# Patient Record
Sex: Female | Born: 1945 | Race: White | Hispanic: No | Marital: Married | Smoking: Never smoker
Health system: Southern US, Community
[De-identification: ages and names within clinical notes are randomized; demographics above are authoritative.]

## PROBLEM LIST (undated history)

## (undated) DIAGNOSIS — M5430 Sciatica, unspecified side: Secondary | ICD-10-CM

## (undated) DIAGNOSIS — Z780 Asymptomatic menopausal state: Secondary | ICD-10-CM

## (undated) DIAGNOSIS — E781 Pure hyperglyceridemia: Secondary | ICD-10-CM

## (undated) DIAGNOSIS — M8580 Other specified disorders of bone density and structure, unspecified site: Secondary | ICD-10-CM

## (undated) DIAGNOSIS — I10 Essential (primary) hypertension: Secondary | ICD-10-CM

## (undated) HISTORY — PX: OTHER SURGICAL HISTORY: SHX169

## (undated) HISTORY — DX: Other specified disorders of bone density and structure, unspecified site: M85.80

## (undated) HISTORY — DX: Pure hyperglyceridemia: E78.1

## (undated) HISTORY — DX: Sciatica, unspecified side: M54.30

## (undated) HISTORY — PX: BREAST BIOPSY: SHX20

## (undated) HISTORY — DX: Asymptomatic menopausal state: Z78.0

## (undated) HISTORY — PX: WISDOM TOOTH EXTRACTION: SHX21

---

## 1997-08-11 ENCOUNTER — Other Ambulatory Visit: Admission: RE | Admit: 1997-08-11 | Discharge: 1997-08-11 | Payer: Self-pay | Admitting: *Deleted

## 1999-03-14 ENCOUNTER — Other Ambulatory Visit: Admission: RE | Admit: 1999-03-14 | Discharge: 1999-03-14 | Payer: Self-pay | Admitting: Obstetrics and Gynecology

## 1999-08-30 ENCOUNTER — Encounter: Admission: RE | Admit: 1999-08-30 | Discharge: 1999-08-30 | Payer: Self-pay | Admitting: Obstetrics and Gynecology

## 1999-08-30 ENCOUNTER — Encounter: Payer: Self-pay | Admitting: Obstetrics and Gynecology

## 1999-10-14 ENCOUNTER — Encounter: Payer: Self-pay | Admitting: Obstetrics and Gynecology

## 1999-10-14 ENCOUNTER — Encounter: Admission: RE | Admit: 1999-10-14 | Discharge: 1999-10-14 | Payer: Self-pay | Admitting: Obstetrics and Gynecology

## 2000-05-03 ENCOUNTER — Other Ambulatory Visit: Admission: RE | Admit: 2000-05-03 | Discharge: 2000-05-03 | Payer: Self-pay | Admitting: Obstetrics and Gynecology

## 2001-05-28 ENCOUNTER — Other Ambulatory Visit: Admission: RE | Admit: 2001-05-28 | Discharge: 2001-05-28 | Payer: Self-pay | Admitting: Obstetrics and Gynecology

## 2001-06-14 ENCOUNTER — Encounter: Payer: Self-pay | Admitting: Obstetrics and Gynecology

## 2001-06-14 ENCOUNTER — Encounter: Admission: RE | Admit: 2001-06-14 | Discharge: 2001-06-14 | Payer: Self-pay | Admitting: Obstetrics and Gynecology

## 2001-11-01 ENCOUNTER — Encounter: Payer: Self-pay | Admitting: Obstetrics and Gynecology

## 2001-11-01 ENCOUNTER — Encounter: Admission: RE | Admit: 2001-11-01 | Discharge: 2001-11-01 | Payer: Self-pay | Admitting: Obstetrics and Gynecology

## 2004-12-26 ENCOUNTER — Encounter: Admission: RE | Admit: 2004-12-26 | Discharge: 2004-12-26 | Payer: Self-pay | Admitting: Obstetrics and Gynecology

## 2005-01-09 ENCOUNTER — Other Ambulatory Visit: Admission: RE | Admit: 2005-01-09 | Discharge: 2005-01-09 | Payer: Self-pay | Admitting: Obstetrics and Gynecology

## 2005-02-28 ENCOUNTER — Ambulatory Visit: Payer: Self-pay | Admitting: Internal Medicine

## 2005-03-15 ENCOUNTER — Ambulatory Visit: Payer: Self-pay | Admitting: Internal Medicine

## 2005-04-19 ENCOUNTER — Ambulatory Visit: Payer: Self-pay | Admitting: Internal Medicine

## 2007-03-07 ENCOUNTER — Encounter: Admission: RE | Admit: 2007-03-07 | Discharge: 2007-03-07 | Payer: Self-pay | Admitting: Internal Medicine

## 2007-03-07 ENCOUNTER — Ambulatory Visit: Payer: Self-pay | Admitting: Internal Medicine

## 2007-03-08 DIAGNOSIS — M899 Disorder of bone, unspecified: Secondary | ICD-10-CM | POA: Insufficient documentation

## 2007-03-08 DIAGNOSIS — E781 Pure hyperglyceridemia: Secondary | ICD-10-CM | POA: Insufficient documentation

## 2007-03-08 DIAGNOSIS — M949 Disorder of cartilage, unspecified: Secondary | ICD-10-CM

## 2007-03-11 ENCOUNTER — Ambulatory Visit: Payer: Self-pay | Admitting: Internal Medicine

## 2007-04-17 ENCOUNTER — Encounter: Payer: Self-pay | Admitting: Internal Medicine

## 2007-04-17 ENCOUNTER — Ambulatory Visit: Payer: Self-pay | Admitting: Internal Medicine

## 2007-06-25 ENCOUNTER — Encounter: Payer: Self-pay | Admitting: Internal Medicine

## 2007-07-16 ENCOUNTER — Telehealth (INDEPENDENT_AMBULATORY_CARE_PROVIDER_SITE_OTHER): Payer: Self-pay | Admitting: *Deleted

## 2008-10-05 ENCOUNTER — Ambulatory Visit: Payer: Self-pay | Admitting: Internal Medicine

## 2008-10-06 DIAGNOSIS — Z780 Asymptomatic menopausal state: Secondary | ICD-10-CM | POA: Insufficient documentation

## 2008-12-30 ENCOUNTER — Encounter (INDEPENDENT_AMBULATORY_CARE_PROVIDER_SITE_OTHER): Payer: Self-pay | Admitting: *Deleted

## 2009-07-09 ENCOUNTER — Ambulatory Visit: Payer: Self-pay | Admitting: Internal Medicine

## 2009-07-20 ENCOUNTER — Ambulatory Visit: Payer: Self-pay | Admitting: Internal Medicine

## 2009-07-20 LAB — CONVERTED CEMR LAB
Ketones, urine, test strip: NEGATIVE
Nitrite: POSITIVE
Urobilinogen, UA: 0.2

## 2009-07-29 ENCOUNTER — Encounter: Payer: Self-pay | Admitting: Internal Medicine

## 2009-10-20 ENCOUNTER — Ambulatory Visit: Payer: Self-pay | Admitting: Internal Medicine

## 2009-10-20 LAB — CONVERTED CEMR LAB
AST: 22 units/L (ref 0–37)
Albumin: 4.2 g/dL (ref 3.5–5.2)
Alkaline Phosphatase: 49 units/L (ref 39–117)
BUN: 15 mg/dL (ref 6–23)
Basophils Relative: 0.7 % (ref 0.0–3.0)
Chloride: 100 meq/L (ref 96–112)
GFR calc non Af Amer: 72.89 mL/min (ref >60)
Glucose, Bld: 85 mg/dL (ref 70–99)
Hemoglobin, Urine: NEGATIVE
Hemoglobin: 12.8 g/dL (ref 12.0–15.0)
Ketones, ur: NEGATIVE mg/dL
Lymphocytes Relative: 33.2 % (ref 12.0–46.0)
Monocytes Relative: 9.2 % (ref 3.0–12.0)
Neutro Abs: 3.6 10*3/uL (ref 1.4–7.7)
Potassium: 4.9 meq/L (ref 3.5–5.1)
RBC: 3.93 M/uL (ref 3.87–5.11)
Specific Gravity, Urine: 1.01 (ref 1.000–1.030)
Total CHOL/HDL Ratio: 5
Total Protein: 6.9 g/dL (ref 6.0–8.3)
Urine Glucose: NEGATIVE mg/dL
Urobilinogen, UA: 0.2 (ref 0.0–1.0)
VLDL: 41.2 mg/dL — ABNORMAL HIGH (ref 0.0–40.0)

## 2009-10-25 ENCOUNTER — Ambulatory Visit: Payer: Self-pay | Admitting: Internal Medicine

## 2009-10-25 ENCOUNTER — Encounter: Payer: Self-pay | Admitting: Internal Medicine

## 2009-10-28 ENCOUNTER — Encounter (INDEPENDENT_AMBULATORY_CARE_PROVIDER_SITE_OTHER): Payer: Self-pay | Admitting: *Deleted

## 2009-11-01 ENCOUNTER — Telehealth: Payer: Self-pay | Admitting: Internal Medicine

## 2009-11-29 ENCOUNTER — Encounter: Payer: Self-pay | Admitting: Internal Medicine

## 2009-12-02 ENCOUNTER — Encounter: Payer: Self-pay | Admitting: Internal Medicine

## 2009-12-09 ENCOUNTER — Encounter: Admission: RE | Admit: 2009-12-09 | Discharge: 2009-12-09 | Payer: Self-pay | Admitting: Internal Medicine

## 2010-02-18 ENCOUNTER — Encounter (INDEPENDENT_AMBULATORY_CARE_PROVIDER_SITE_OTHER): Payer: Self-pay | Admitting: *Deleted

## 2010-02-21 ENCOUNTER — Ambulatory Visit
Admission: RE | Admit: 2010-02-21 | Discharge: 2010-02-21 | Payer: Self-pay | Source: Home / Self Care | Attending: Internal Medicine | Admitting: Internal Medicine

## 2010-03-01 ENCOUNTER — Encounter: Payer: Self-pay | Admitting: Internal Medicine

## 2010-03-01 ENCOUNTER — Ambulatory Visit
Admission: RE | Admit: 2010-03-01 | Discharge: 2010-03-01 | Payer: Self-pay | Source: Home / Self Care | Attending: Internal Medicine | Admitting: Internal Medicine

## 2010-03-01 LAB — HM COLONOSCOPY

## 2010-03-13 LAB — CONVERTED CEMR LAB
ALT: 36 units/L — ABNORMAL HIGH (ref 0–35)
AST: 31 units/L (ref 0–37)
Albumin: 4.1 g/dL (ref 3.5–5.2)
Alkaline Phosphatase: 59 units/L (ref 39–117)
BUN: 10 mg/dL (ref 6–23)
Basophils Absolute: 0 10*3/uL (ref 0.0–0.1)
Basophils Relative: 0.4 % (ref 0.0–1.0)
Bilirubin Urine: NEGATIVE
Bilirubin, Direct: 0.1 mg/dL (ref 0.0–0.3)
CO2: 30 meq/L (ref 19–32)
Calcium: 9.7 mg/dL (ref 8.4–10.5)
Chloride: 100 meq/L (ref 96–112)
Cholesterol: 165 mg/dL (ref 0–200)
Creatinine, Ser: 0.9 mg/dL (ref 0.4–1.2)
Eosinophils Absolute: 0.2 10*3/uL (ref 0.0–0.6)
Eosinophils Relative: 2.4 % (ref 0.0–5.0)
GFR calc Af Amer: 82 mL/min
GFR calc non Af Amer: 68 mL/min
Glucose, Bld: 98 mg/dL (ref 70–99)
HCT: 36.9 % (ref 36.0–46.0)
HDL: 31.5 mg/dL — ABNORMAL LOW (ref >39.0)
Hemoglobin, Urine: NEGATIVE
Hemoglobin: 12.7 g/dL (ref 12.0–15.0)
Ketones, ur: NEGATIVE mg/dL
LDL Cholesterol: 109 mg/dL — ABNORMAL HIGH (ref 0–99)
Leukocytes, UA: NEGATIVE
Lymphocytes Relative: 36.5 % (ref 12.0–46.0)
MCHC: 34.5 g/dL (ref 30.0–36.0)
MCV: 93.3 fL (ref 78.0–100.0)
Monocytes Absolute: 0.7 10*3/uL (ref 0.2–0.7)
Monocytes Relative: 9.3 % (ref 3.0–11.0)
Neutro Abs: 3.5 10*3/uL (ref 1.4–7.7)
Neutrophils Relative %: 51.4 % (ref 43.0–77.0)
Nitrite: NEGATIVE
Platelets: 346 10*3/uL (ref 150–400)
Potassium: 4.4 meq/L (ref 3.5–5.1)
RBC: 3.95 M/uL (ref 3.87–5.11)
RDW: 11.8 % (ref 11.5–14.6)
Sodium: 137 meq/L (ref 135–145)
Specific Gravity, Urine: 1.01 (ref 1.000–1.03)
TSH: 2.32 microintl units/mL (ref 0.35–5.50)
Total Bilirubin: 0.6 mg/dL (ref 0.3–1.2)
Total CHOL/HDL Ratio: 5.2
Total Protein, Urine: NEGATIVE mg/dL
Total Protein: 6.9 g/dL (ref 6.0–8.3)
Triglycerides: 121 mg/dL (ref 0–149)
Urine Glucose: NEGATIVE mg/dL
Urobilinogen, UA: 0.2 (ref 0.0–1.0)
VLDL: 24 mg/dL (ref 0–40)
WBC: 7 10*3/uL (ref 4.5–10.5)
pH: 6 (ref 5.0–8.0)

## 2010-03-17 NOTE — Assessment & Plan Note (Signed)
Summary: ?uti/men/cd   Vital Signs:  Patient profile:   65 year old female Height:      65.5 inches Weight:      135.75 pounds O2 Sat:      97 % Temp:     97.0 degrees F oral Pulse rate:   82 / minute Pulse rhythm:   regular Resp:     16 per minute BP sitting:   142 / 84  (left arm) Cuff size:   large  Vitals Entered By: Rock Nephew CMA (July 20, 2009 8:46 AM) CC: urinary frequancy and burning, Dysuria   Primary Care Provider:  Illene Regulus  CC:  urinary frequancy and burning and Dysuria.  History of Present Illness:  Dysuria      This is a 65 year old woman who presents with Dysuria.  The symptoms began 2 days ago.  The intensity is described as moderate.  The patient reports burning with urination, urinary frequency, and urgency, but denies hematuria, vaginal discharge, vaginal itching, and vaginal sores.  The patient denies the following associated symptoms: nausea, vomiting, fever, shaking chills, flank pain, abdominal pain, back pain, pelvic pain, and arthralgias.  History is significant for no urinary tract problems.    Preventive Screening-Counseling & Management  Alcohol-Tobacco     Alcohol drinks/day: 0     Smoking Status: never  Hep-HIV-STD-Contraception     Hepatitis Risk: no risk noted     HIV Risk: no risk noted     STD Risk: no risk noted      Sexual History:  currently monogamous.        Drug Use:  never.        Blood Transfusions:  no.    Medications Prior to Update: 1)  Multivitamins   Tabs (Multiple Vitamin) .... Take 1 Tablet By Mouth Once A Day 2)  Alpha Lipoic Acid 300 Mg  Caps (Alpha-Lipoic Acid) .... Take 1 Tablet By Mouth Once A Day 3)  Black Cohosh 540 Mg  Caps (Black Cohosh) .... Take 1 Tablet By Mouth Two Times A Day 4)  Glucosamine Hcl 1000 Mg  Tabs (Glucosamine Hcl) .... Take 1 Tablet By Mouth Once A Day 5)  Omega-3 Fish Oil 1000 Mg  Caps (Omega-3 Fatty Acids) .... Take 1 Tablet By Mouth Once A Day 6)  Lecithin 1200 Mg  Caps  (Lecithin) .... Take 1 Tablet By Mouth Once A Day 7)  Vitamin E 400 Unit  Caps (Vitamin E) .... Take 1 Tablet By Mouth Once A Day 8)  B-100 Complex   Tabs (Vitamins-Lipotropics) .... Take 1 Tablet By Mouth Once A Day 9)  Calcium-Magnesium-Vitamin D 400-166.7-133.3 Mg-Mg-Uni  Tabs (Calcium-Magnesium-Vitamin D) .... Take 1 Tablet By Mouth Once A Day 10)  Vitamin D 1000 Unit  Tabs (Cholecalciferol) .... Take 1 Tablet By Mouth Once A Day  Current Medications (verified): 1)  Multivitamins   Tabs (Multiple Vitamin) .... Take 1 Tablet By Mouth Once A Day 2)  Alpha Lipoic Acid 300 Mg  Caps (Alpha-Lipoic Acid) .... Take 1 Tablet By Mouth Once A Day 3)  Black Cohosh 540 Mg  Caps (Black Cohosh) .... Take 1 Tablet By Mouth Two Times A Day 4)  Glucosamine Hcl 1000 Mg  Tabs (Glucosamine Hcl) .... Take 1 Tablet By Mouth Once A Day 5)  Omega-3 Fish Oil 1000 Mg  Caps (Omega-3 Fatty Acids) .... Take 1 Tablet By Mouth Once A Day 6)  Lecithin 1200 Mg  Caps (Lecithin) .... Take  1 Tablet By Mouth Once A Day 7)  Vitamin E 400 Unit  Caps (Vitamin E) .... Take 1 Tablet By Mouth Once A Day 8)  B-100 Complex   Tabs (Vitamins-Lipotropics) .... Take 1 Tablet By Mouth Once A Day 9)  Calcium-Magnesium-Vitamin D 400-166.7-133.3 Mg-Mg-Uni  Tabs (Calcium-Magnesium-Vitamin D) .... Take 1 Tablet By Mouth Once A Day 10)  Vitamin D 1000 Unit  Tabs (Cholecalciferol) .... Take 1 Tablet By Mouth Once A Day 11)  Cipro 250 Mg Tab (Ciprofloxacin Hcl) .... Take 1 Tablet By Mouth Morning and Night X 5 Days  Allergies (verified): No Known Drug Allergies  Past History:  Past Medical History: Last updated: 11/02/08 POSTMENOPAUSAL STATUS (ICD-V49.81) HYPERTRIGLYCERIDEMIA (ICD-272.1) OSTEOPENIA (ICD-733.90)  Past Surgical History: Last updated: 11/02/2008 Wisdom teeth extracted remotely   G0P0  Family History: Last updated: 11-02-2008 Mother deceased at age 57 - DM, Factor V Leiden, possible PE Father deceased at age 59 -  history of emphysema, Htn, CVA Sister - high cholesterol Youngest sister - breast cancer Neg- colon cancer, CAD/MI  Social History: Last updated: 11/02/08 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 Never Smoked Alcohol use-yes (social)  Risk Factors: Alcohol Use: 0 (07/20/2009)  Risk Factors: Smoking Status: never (07/20/2009)  Family History: Reviewed history from November 02, 2008 and no changes required. Mother deceased at age 18 - DM, Factor V Leiden, possible PE Father deceased at age 8 - history of emphysema, Htn, CVA Sister - high cholesterol Youngest sister - breast cancer Neg- colon cancer, CAD/MI  Social History: Reviewed history from 11/02/2008 and no changes required. 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 Never Smoked Alcohol use-yes (social) Hepatitis Risk:  no risk noted HIV Risk:  no risk noted STD Risk:  no risk noted Sexual History:  currently monogamous Drug Use:  never Blood Transfusions:  no  Review of Systems  The patient denies anorexia, fever, chest pain, abdominal pain, hematuria, incontinence, genital sores, suspicious skin lesions, and enlarged lymph nodes.    Physical Exam  General:  alert, well-developed, well-nourished, well-hydrated, appropriate dress, and healthy-appearing.   Head:  normocephalic, atraumatic, no abnormalities observed, and no abnormalities palpated.   Mouth:  Oral mucosa and oropharynx without lesions or exudates.  Teeth in good repair. Neck:  supple, full ROM, no masses, no thyromegaly, no JVD, normal carotid upstroke, and no carotid bruits.   Lungs:  normal respiratory effort, no intercostal retractions, no accessory muscle use, normal  breath sounds, no dullness, no fremitus, no crackles, and no wheezes.   Heart:  normal rate, regular rhythm, no murmur, no gallop, no rub, and no JVD.   Abdomen:  No CVAT, soft, non-tender, normal bowel sounds, no distention, no masses, no guarding, no rigidity, no rebound tenderness, no hepatomegaly, and no splenomegaly.   Msk:  normal ROM, no joint tenderness, no joint swelling, no joint warmth, no redness over joints, no joint deformities, and no joint instability.   Pulses:  R and L carotid,radial,femoral,dorsalis pedis and posterior tibial pulses are full and equal bilaterally Extremities:  No clubbing, cyanosis, edema, or deformity noted with normal full range of motion of all joints.   Neurologic:  No cranial nerve deficits noted. Station and gait are normal.  Plantar reflexes are down-going bilaterally. DTRs are symmetrical throughout. Sensory, motor and coordinative functions appear intact. Skin:  turgor normal, color normal, no rashes, no suspicious lesions, no ecchymoses, no petechiae, no purpura, no ulcerations, and no edema.   Cervical Nodes:  no anterior cervical adenopathy and no posterior cervical adenopathy.   Psych:  Cognition and judgment appear intact. Alert and cooperative with normal attention span and concentration. No apparent delusions, illusions, hallucinations   Impression & Recommendations:  Problem # 1:  UTI (ICD-599.0)  Her updated medication list for this problem includes:    Cipro 250 Mg Tab (Ciprofloxacin hcl) .Marland Kitchen... Take 1 tablet by mouth morning and night x 5 days  Encouraged to push clear liquids, get enough rest, and take acetaminophen as needed. To be seen in 10 days if no improvement, sooner if worse.  Complete Medication List: 1)  Multivitamins Tabs (Multiple vitamin) .... Take 1 tablet by mouth once a day 2)  Alpha Lipoic Acid 300 Mg Caps (Alpha-lipoic acid) .... Take 1 tablet by mouth once a day 3)  Black Cohosh 540 Mg Caps (Black cohosh) .... Take 1  tablet by mouth two times a day 4)  Glucosamine Hcl 1000 Mg Tabs (Glucosamine hcl) .... Take 1 tablet by mouth once a day 5)  Omega-3 Fish Oil 1000 Mg Caps (Omega-3 fatty acids) .... Take 1 tablet by mouth once a day 6)  Lecithin 1200 Mg Caps (Lecithin) .... Take 1 tablet by mouth once a day 7)  Vitamin E 400 Unit Caps (Vitamin e) .... Take 1 tablet by mouth once a day 8)  B-100 Complex Tabs (Vitamins-lipotropics) .... Take 1 tablet by mouth once a day 9)  Calcium-magnesium-vitamin D 400-166.7-133.3 Mg-mg-uni Tabs (Calcium-magnesium-vitamin d) .... Take 1 tablet by mouth once a day 10)  Vitamin D 1000 Unit Tabs (Cholecalciferol) .... Take 1 tablet by mouth once a day 11)  Cipro 250 Mg Tab (Ciprofloxacin hcl) .... Take 1 tablet by mouth morning and night x 5 days  Other Orders: UA Dipstick w/o Micro (manual) (03474)  Patient Instructions: 1)  Please schedule a follow-up appointment in 2 weeks. 2)  Take your antibiotic as prescribed until ALL of it is gone, but stop if you develop a rash or swelling and contact our office as soon as possible. Prescriptions: CIPRO 250 MG TAB (CIPROFLOXACIN HCL) Take 1 tablet by mouth morning and night X 5 days  #10 x 1   Entered and Authorized by:   Etta Grandchild MD   Signed by:   Etta Grandchild MD on 07/20/2009   Method used:   [facility]  [address]  [phone]* (retail)       [address] [place], [place]  [address]       Ph: [phone] or [phone]       Fax: [phone]   RxID:   [id number]   Laboratory Results   Urine Tests  Date/Time Received: Rock Nephew CMA  July 20, 2009 8:54 AM   Routine Urinalysis   Color: orange Appearance: Hazy Glucose: negative   (Normal Range: Negative) Bilirubin: negative   (Normal Range: Negative) Ketone: negative   (Normal Range: Negative) Spec. Gravity: <1.005   (Normal Range: 1.003-1.035) Blood: large   (Normal Range: Negative) pH: 5.0   (Normal Range:  5.0-8.0) Protein: trace   (Normal Range: Negative) Urobilinogen: 0.2   (Normal Range: 0-1) Nitrite: positive   (Normal  Range: Negative) Leukocyte Esterace: large   (Normal Range: Negative)

## 2010-03-17 NOTE — Letter (Signed)
Summary: Healthy Environmental health practitioner  Healthy Incentives Program/[facility]   Imported By: Sherian Rein 12/07/2009 11:12:12  _____________________________________________________________________  External Attachment:    Type:   Image     Comment:   External Document

## 2010-03-17 NOTE — Letter (Signed)
Summary: Pre Visit Letter Revised  [facility]  [address] [place], [place] [address]   Phone: [phone]  Fax: [phone]        10/28/2009 MRN: [id number] [facility] [address] [place], [place]  [address]             Procedure Date:  12-10-09   Welcome to the Gastroenterology Division at [facility].    You are scheduled to see a nurse for your pre-procedure visit on 11-26-09 at 1:30P.M. on the 3rd floor at [facility], [address]  We ask that you try to arrive at our office 15 minutes prior to your appointment time to allow for check-in.  Please take a minute to review the attached form.  If you answer "Yes" to one or more of the questions on the first page, we ask that you call the person listed at your earliest opportunity.  If you answer "No" to all of the questions, please complete the rest of the form and bring it to your appointment.    Your nurse visit will consist of discussing your medical and surgical history, your immediate family medical history, and your medications.   If you are unable to list all of your medications on the form, please bring the medication bottles to your appointment and we will list them.  We will need to be aware of both prescribed and over the counter drugs.  We will need to know exact dosage information as well.    Please be prepared to read and sign documents such as consent forms, a financial agreement, and acknowledgement forms.  If necessary, and with your consent, a friend or relative is welcome to sit-in on the nurse visit with you.  Please bring your insurance card so that we may make a copy of it.  If your insurance requires a referral to see a specialist, please bring your referral form from your primary care physician.  No co-pay is required for this nurse visit.     If you cannot keep your appointment, please call [phone] to cancel or reschedule prior to your appointment date.  This  allows [place] the opportunity to schedule an appointment for another patient in need of care.    Thank you for choosing [facility] for your medical needs.  We appreciate the opportunity to care for you.  Please visit [place] at our website  to learn more about our practice.  Sincerely, The Gastroenterology Division

## 2010-03-17 NOTE — Assessment & Plan Note (Signed)
Summary: cough-oyu   Vital Signs:  Patient profile:   65 year old female Height:      65.5 inches Weight:      134 pounds BMI:     22.04 O2 Sat:      95 % on Room air Temp:     97.8 degrees F oral Pulse rate:   92 / minute BP sitting:   182 / 100  (left arm) Cuff size:   regular  Vitals Entered By: Bill Salinas CMA (Jul 09, 2009 9:55 AM)  O2 Flow:  Room air CC: pt here with c/o coughing with SOB x 3 days/ pt is due for tetanus, and mammogram. she has never had zostavax or colonoscopy/ ab   Primary Care Provider:  Illene Regulus  CC:  pt here with c/o coughing with SOB x 3 days/ pt is due for tetanus and and mammogram. she has never had zostavax or colonoscopy/ ab.  History of Present Illness: Patient presents with a 5 day h/o chest congestion, sniffling, cough - non productive, clear mucus when she blows her nose. NO fever. She did have what she thinks may be mold exposure and that this may be an allergic reaction. She has tried benadryl, claritin and using a humidifier.   Current Medications (verified): 1)  Multivitamins   Tabs (Multiple Vitamin) .... Take 1 Tablet By Mouth Once A Day 2)  Alpha Lipoic Acid 300 Mg  Caps (Alpha-Lipoic Acid) .... Take 1 Tablet By Mouth Once A Day 3)  Black Cohosh 540 Mg  Caps (Black Cohosh) .... Take 1 Tablet By Mouth Two Times A Day 4)  Glucosamine Hcl 1000 Mg  Tabs (Glucosamine Hcl) .... Take 1 Tablet By Mouth Once A Day 5)  Omega-3 Fish Oil 1000 Mg  Caps (Omega-3 Fatty Acids) .... Take 1 Tablet By Mouth Once A Day 6)  Lecithin 1200 Mg  Caps (Lecithin) .... Take 1 Tablet By Mouth Once A Day 7)  Vitamin E 400 Unit  Caps (Vitamin E) .... Take 1 Tablet By Mouth Once A Day 8)  B-100 Complex   Tabs (Vitamins-Lipotropics) .... Take 1 Tablet By Mouth Once A Day 9)  Calcium-Magnesium-Vitamin D 400-166.7-133.3 Mg-Mg-Uni  Tabs (Calcium-Magnesium-Vitamin D) .... Take 1 Tablet By Mouth Once A Day 10)  Vitamin D 1000 Unit  Tabs (Cholecalciferol) .... Take 1  Tablet By Mouth Once A Day  Allergies (verified): No Known Drug Allergies  Past History:  Past Medical History: Last updated: 10-30-08 POSTMENOPAUSAL STATUS (ICD-V49.81) HYPERTRIGLYCERIDEMIA (ICD-272.1) OSTEOPENIA (ICD-733.90)  Past Surgical History: Last updated: 30-Oct-2008 Wisdom teeth extracted remotely   G0P0  Family History: Last updated: 10/30/2008 Mother deceased at age 83 - DM, Factor V Leiden, possible PE Father deceased at age 80 - history of emphysema, Htn, CVA Sister - high cholesterol Youngest sister - breast cancer Neg- colon cancer, CAD/MI  Social History: Last updated: 2008/10/30 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 Never Smoked Alcohol use-yes (social)  Review of Systems       The patient complains of anorexia and hoarseness.  The patient denies fever, decreased hearing, chest pain, dyspnea on exertion, abdominal pain, suspicious skin lesions, difficulty walking, depression, and enlarged lymph nodes.    Physical Exam  General:  Well-developed,well-nourished,in no acute distress; alert,appropriate and cooperative throughout examination Head:  normocephalic and atraumatic.  No tenderness to percussion  oveer frontal or maxillary sinus Eyes:  vision grossly intact, pupils equal, and pupils round.   Ears:  External ear exam shows no significant lesions or deformities.  Otoscopic examination reveals clear canals, tympanic membranes are intact bilaterally without bulging, retraction, inflammation or discharge. Hearing is grossly normal bilaterally. Mouth:  posrterior pharynx clear Neck:  supple.   Lungs:  Normal respiratory effort, chest expands symmetrically. Lungs are clear to auscultation, no crackles or wheezes. Heart:  normal rate and regular rhythm.   Neurologic:  alert & oriented X3 and cranial nerves  II-XII intact.   Skin:  turgor normal and no rashes.   Cervical Nodes:  no anterior cervical adenopathy and no posterior cervical adenopathy.   Psych:  Oriented X3 and memory intact for recent and remote.     Impression & Recommendations:  Problem # 1:  ALLERGIC RHINITIS CAUSE UNSPECIFIED (ICD-477.9) No evidence of bacterial infection. May be viral illness vs alleric rhinnitis.  Plan - supportive care           otc non-sdedating antihistamine           dextromethorophan and qaufenesin products of choice.  Complete Medication List: 1)  Multivitamins Tabs (Multiple vitamin) .... Take 1 tablet by mouth once a day 2)  Alpha Lipoic Acid 300 Mg Caps (Alpha-lipoic acid) .... Take 1 tablet by mouth once a day 3)  Black Cohosh 540 Mg Caps (Black cohosh) .... Take 1 tablet by mouth two times a day 4)  Glucosamine Hcl 1000 Mg Tabs (Glucosamine hcl) .... Take 1 tablet by mouth once a day 5)  Omega-3 Fish Oil 1000 Mg Caps (Omega-3 fatty acids) .... Take 1 tablet by mouth once a day 6)  Lecithin 1200 Mg Caps (Lecithin) .... Take 1 tablet by mouth once a day 7)  Vitamin E 400 Unit Caps (Vitamin e) .... Take 1 tablet by mouth once a day 8)  B-100 Complex Tabs (Vitamins-lipotropics) .... Take 1 tablet by mouth once a day 9)  Calcium-magnesium-vitamin D 400-166.7-133.3 Mg-mg-uni Tabs (Calcium-magnesium-vitamin d) .... Take 1 tablet by mouth once a day 10)  Vitamin D 1000 Unit Tabs (Cholecalciferol) .... Take 1 tablet by mouth once a day   Immunization History:  Influenza Immunization History:    Influenza:  declined (07/09/2009)

## 2010-03-17 NOTE — Miscellaneous (Signed)
Summary: [facility] previsit- with propofol  Clinical Lists Changes  Medications: Added new medication of DULCOLAX 5 MG  TBEC (BISACODYL) Day before procedure take 2 at 3pm and 2 at 8pm. - Signed Added new medication of METOCLOPRAMIDE HCL 10 MG  TABS (METOCLOPRAMIDE HCL) As per prep instructions. - Signed Added new medication of MIRALAX   POWD (POLYETHYLENE GLYCOL 3350) As per prep  instructions. - Signed Rx of DULCOLAX 5 MG  TBEC (BISACODYL) Day before procedure take 2 at 3pm and 2 at 8pm.;  #4 x 0;  Signed;  Entered by: Karl Bales RN;  Authorized by: Hart Carwin MD;  Method used: [facility]  [phone]*, [address] [place], [place]  [address], Ph: [phone] or [phone], Fax: [phone] Rx of METOCLOPRAMIDE HCL 10 MG  TABS (METOCLOPRAMIDE HCL) As per prep instructions.;  #2 x 0;  Signed;  Entered by: Karl Bales RN;  Authorized by: Hart Carwin MD;  Method used: Electronically to [facility]  [phone]*, [address] [place], [place]  [address], Ph: [phone] or [phone], Fax: [phone] Rx of MIRALAX   POWD (POLYETHYLENE GLYCOL 3350) As per prep  instructions.;  #255gm x 0;  Signed;  Entered by: Karl Bales RN;  Authorized by: Hart Carwin MD;  Method used: Electronically to [facility]  [phone]*, [address], [place], [place]  [address], Ph: [phone] or [phone], Fax: [phone] Allergies: Added new allergy or adverse reaction of * CHEMICAL PRESERVATIVES Observations: Added new observation of NKA: F (02/21/2010 15:58)    Prescriptions: MIRALAX   POWD (POLYETHYLENE GLYCOL 3350) As per prep  instructions.  #255gm x 0   Entered by:   Karl Bales RN   Authorized by:   Hart Carwin MD   Signed by:   Karl Bales RN on 02/21/2010   Method used:   Electronically to        [facility]  [phone]* (retail)       [address] [place], [place]  [address]       Ph: [phone] or [phone]   Fax: [phone]   RxID:   [id number] METOCLOPRAMIDE HCL 10 MG  TABS (METOCLOPRAMIDE HCL) As per prep instructions.  #2 x 0   Entered by:   Karl Bales RN   Authorized by:   Hart Carwin MD   Signed by:   Karl Bales RN on 02/21/2010   Method used:   Electronically to        [facility]  [phone]* (retail)       [address] [place], [place]  [address]       Ph: [phone] or [phone]       Fax: [phone]   RxID:   [id number] DULCOLAX 5 MG  TBEC (BISACODYL) Day before procedure take 2 at 3pm and 2 at 8pm.  #4 x 0   Entered by:   Karl Bales RN   Authorized by:   Hart Carwin MD   Signed by:   Karl Bales RN on 02/21/2010   Method used:   Electronically to        [facility]  [phone]* (retail)       [address] [place], [place]  [address]       Ph: [phone] or [phone]       Fax: [phone]  RxID:   [id number]

## 2010-03-17 NOTE — Letter (Signed)
Summary: Healthy Physiological scientist  Healthy Incentives Program Form/[facility]   Imported By: Sherian Rein 12/02/2009 12:24:44  _____________________________________________________________________  External Attachment:    Type:   Image     Comment:   External Document

## 2010-03-17 NOTE — Letter (Signed)
   [facility] [address] [place], [place]  [address] Phone: [phone]      July 29, 2009   [facility] [address] [place], [place] [address]  RE:  LAB RESULTS  Dear  Ms. CRESPIN,  The following is an interpretation of your most recent lab tests.  Please take note of any instructions provided or changes to medications that have resulted from your lab work.    Bone density study reveals osteopenia at spine and hips. No medication is indicated. Treatment is calcium, vitamin D and weight bearing exercise.    For any questions please schedule an office visit.   Sincerely Yours,    Jacques Navy MD

## 2010-03-17 NOTE — Assessment & Plan Note (Signed)
Summary: CPX/NWS  #   Vital Signs:  Patient profile:   65 year old female Height:      65.5 inches Weight:      136 pounds BMI:     22.37 O2 Sat:      98 % on Room air Temp:     98.0 degrees F oral Pulse rate:   83 / minute BP sitting:   144 / 82  (left arm) Cuff size:   regular  Vitals Entered By: Ana Guzman (October 25, 2009 2:27 PM)  O2 Flow:  Room air CC: pt here for cpx. /cp sma Comments pt will have tetanus booster today. she has never had zosta vac. she has never had a colonoscopy. she is due for mammogram.    Primary Care Provider:  Illene Guzman  CC:  pt here for cpx. /cp sma.  History of Present Illness: Patient presents for routine exam. At her last visit Spring she had allergic rhinnitis that finally did respond to a round of steroids. Also in the spring she had a UTI which responded to a round of cipro. she has had a bone density study with osteopenia but unchanged from previous study. She is due for Gyn exam and will schedule with Ana Guzman. She is due for [facility]ogram [facility]. discussed colon cancer screening and she is willing to be scheduled.   Current Medications (verified): 1)  Multivitamins   Tabs (Multiple Vitamin) .... Take 1 Tablet By Mouth Once A Day 2)  Alpha Lipoic Acid 300 Mg  Caps (Alpha-Lipoic Acid) .... Take 1 Tablet By Mouth Once A Day 3)  Black Cohosh 540 Mg  Caps (Black Cohosh) .... Take 1 Tablet By Mouth Two Times A Day 4)  Glucosamine Hcl 1000 Mg  Tabs (Glucosamine Hcl) .... Take 1 Tablet By Mouth Once A Day 5)  Omega-3 Fish Oil 1000 Mg  Caps (Omega-3 Fatty Acids) .... Take 1 Tablet By Mouth Once A Day 6)  Lecithin 1200 Mg  Caps (Lecithin) .... Take 1 Tablet By Mouth Once A Day 7)  B-100 Complex   Tabs (Vitamins-Lipotropics) .... Take 1 Tablet By Mouth Once A Day 8)  Calcium-Magnesium-Vitamin D 400-166.7-133.3 Mg-Mg-Uni  Tabs (Calcium-Magnesium-Vitamin D) .... Take 1 Tablet By Mouth Once  A Day 9)  Vitamin D 1000 Unit  Tabs (Cholecalciferol) .... Take 1 Tablet By Mouth Once A Day  Allergies (verified): No Known Drug Allergies  Past History:  Past Medical History: Last updated: 11-03-08 POSTMENOPAUSAL STATUS (ICD-V49.81) HYPERTRIGLYCERIDEMIA (ICD-272.1) OSTEOPENIA (ICD-733.90)  Past Surgical History: Last updated: 11/03/08 Wisdom teeth extracted remotely   G0P0  Family History: Last updated: November 03, 2008 Mother deceased at age 79 - DM, Factor V Leiden, possible PE Father deceased at age 65 - history of emphysema, Htn, CVA Sister - high cholesterol Youngest sister - breast cancer Neg- colon cancer, CAD/MI  Social History: Last updated: 03-Nov-2008 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 Never Smoked Alcohol use-yes (social)  Risk Factors: Alcohol Use: 0 (07/20/2009)  Review of Systems  The patient denies anorexia, fever, weight loss, weight gain, vision loss, decreased hearing, hoarseness, chest pain, dyspnea on exertion, peripheral edema, prolonged cough, headaches, abdominal pain, severe indigestion/heartburn, hematuria, incontinence, genital sores, muscle weakness, difficulty walking, depression, unusual weight change, abnormal bleeding, enlarged lymph nodes, angioedema, and breast  masses.         was having knee pain , bewtter with glucosamine. Some soreness at ankles  Physical Exam  General:  WNWD white female in no distress Head:  Normocephalic and atraumatic without obvious abnormalities. No apparent alopecia or balding. Eyes:  vision grossly intact, pupils equal, pupils round, corneas and lenses clear, and no injection.   Ears:  External ear exam shows no significant lesions or deformities.  Otoscopic examination reveals clear canals, tympanic membranes are intact bilaterally without bulging,  retraction, inflammation or discharge. Hearing is grossly normal bilaterally. Nose:  no external deformity and no external erythema.   Mouth:  Oral mucosa and oropharynx without lesions or exudates.  Teeth in good repair. Neck:  supple, full ROM, no thyromegaly, and no carotid bruits.   Chest Wall:  no deformities and no tenderness.   Breasts:  deferred to gyn Lungs:  Normal respiratory effort, chest expands symmetrically. Lungs are clear to auscultation, no crackles or wheezes. Heart:  Normal rate and regular rhythm. S1 and S2 normal without gallop, murmur, click, rub or other extra sounds. Abdomen:  soft, non-tender, normal bowel sounds, no guarding, no rigidity, and no hepatomegaly.   Genitalia:  deferred to gyn Msk:  normal ROM, no joint tenderness, no joint swelling, no joint warmth, and no joint instability.   Pulses:  2+ radial, PT and DP pulses bilaterally Extremities:  No clubbing, cyanosis, edema, or deformity noted with normal full range of motion of all joints.   Neurologic:  alert & oriented X3, cranial nerves II-XII intact, strength normal in all extremities, gait normal, and DTRs symmetrical and normal.   Skin:  turgor normal, color normal, no rashes, and no suspicious lesions.   Cervical Nodes:  no anterior cervical adenopathy and no posterior cervical adenopathy.   Psych:  Oriented X3, memory intact for recent and remote, normally interactive, good eye contact, and not anxious appearing.     Impression & Recommendations:  Problem # 1:  HYPERTRIGLYCERIDEMIA (ICD-272.1) Stable triglyerides. LDL at 156 is close to treatment threshold per NCEP guideline. Reviewed previous lab from '09 - LDL = 109!!  Plan - life style management with low fat diet and continued aerobic exercise          follow-up lipid panel in 4-6 months.   Problem # 2:  OSTEOPENIA (ICD-733.90) current with DXA scanning.   Plan - weight bearing exercise           calcium 1200mg  daily and Vit D [phone]  international units daily  Problem # 3:  Preventive Health Care (ICD-V70.0) Unremarkable history.m She is being followed by opthalmology for the beginnings of cataracts. Her exam was unremarkable deferring breast and pelvic exam to gyn. Lab results were in normal limits except for cholesterol panel. Immunizations - tetnus and influenza given today. Tetnus was declined. Patient is a candidate for shingles vaccine. Patient has agreeed to colonoscopy for colorectal cancer screening.   In summary - a very nice woman who is medically stable except for cholesterol control. She will return for lab as instructed. She will scedule gyn exam and mammogram. She will return as needed or in 2 years.   Complete Medication List: 1)  Multivitamins Tabs (Multiple vitamin) .... Take 1 tablet by mouth once a day 2)  Alpha Lipoic Acid 300 Mg Caps (Alpha-lipoic acid) .... Take 1 tablet by mouth once a day 3)  Black Cohosh 540 Mg Caps (Black cohosh) .... Take 1 tablet by mouth two times a  day 4)  Glucosamine Hcl 1000 Mg Tabs (Glucosamine hcl) .... Take 1 tablet by mouth once a day 5)  Omega-3 Fish Oil 1000 Mg Caps (Omega-3 fatty acids) .... Take 1 tablet by mouth once a day 6)  Lecithin 1200 Mg Caps (Lecithin) .... Take 1 tablet by mouth once a day 7)  B-100 Complex Tabs (Vitamins-lipotropics) .... Take 1 tablet by mouth once a day 8)  Calcium-magnesium-vitamin D 400-166.7-133.3 Mg-mg-uni Tabs (Calcium-magnesium-vitamin d) .... Take 1 tablet by mouth once a day 9)  Vitamin D 1000 Unit Tabs (Cholecalciferol) .... Take 1 tablet by mouth once a day  Other Orders: Gastroenterology Referral (GI) Tdap => 90yrs IM (81191) Admin 1st Vaccine (47829) EKG w/ Interpretation (93000)   Patient: Ana Guzman Note: All result statuses are Final unless otherwise noted.  Tests: (1) BMP (METABOL)   Sodium                    137 mEq/L                   135-145   Potassium                 4.9 mEq/L                   3.5-5.1    Chloride                  100 mEq/L                   96-112   Carbon Dioxide            28 mEq/L                    19-32   Glucose                   85 mg/dL                    56-21   BUN                       15 mg/dL                    3-08   Creatinine                0.8 mg/dL                   6.5-7.8   Calcium                   9.6 mg/dL                   4.6-96.2   GFR                       72.89 mL/min                >60  Tests: (2) Lipid Panel (LIPID)   Cholesterol          [H]  238 mg/dL                   9-528     ATP III Classification            Desirable:  < 200 mg/dL  Borderline High:  200 - 239 mg/dL               High:  > = 240 mg/dL   Triglycerides        [H]  206.0 mg/dL                 1.6-109.6     Normal:  <150 mg/dL     Borderline High:  045 - 199 mg/dL   HDL                       40.98 mg/dL                 >11.91   VLDL Cholesterol     [H]  41.2 mg/dL                  4.7-82.9  CHO/HDL Ratio:  CHD Risk                             5                    Men          Women     1/2 Average Risk     3.4          3.3     Average Risk          5.0          4.4     2X Average Risk          9.6          7.1     3X Average Risk          15.0          11.0                           Tests: (3) CBC Platelet w/Diff (CBCD)   White Cell Count          6.7 K/uL                    4.5-10.5   Red Cell Count            3.93 Mil/uL                 3.87-5.11   Hemoglobin                12.8 g/dL                   56.2-13.0   Hematocrit                37.0 %                      36.0-46.0   MCV                       94.2 fl                     78.0-100.0   MCHC                      34.5 g/dL                   86.5-78.4   RDW  13.2 %                      11.5-14.6   Platelet Count            311.0 K/uL                  150.0-400.0   Neutrophil %              53.2 %                      43.0-77.0   Lymphocyte %              33.2 %                       12.0-46.0   Monocyte %                9.2 %                       3.0-12.0   Eosinophils%              3.7 %                       0.0-5.0   Basophils %               0.7 %                       0.0-3.0   Neutrophill Absolute      3.6 K/uL                    1.4-7.7   Lymphocyte Absolute       2.2 K/uL                    0.7-4.0   Monocyte Absolute         0.6 K/uL                    0.1-1.0  Eosinophils, Absolute                             0.3 K/uL                    0.0-0.7   Basophils Absolute        0.0 K/uL                    0.0-0.1  Tests: (4) Hepatic/Liver Function Panel (HEPATIC)   Total Bilirubin           0.7 mg/dL                   8.4-6.9   Direct Bilirubin          0.1 mg/dL                   6.2-9.5   Alkaline Phosphatase      49 U/L                      39-117   AST                       22 U/L  0-37   ALT                       25 U/L                      0-35   Total Protein             6.9 g/dL                    1.6-1.0   Albumin                   4.2 g/dL                    9.6-0.4  Tests: (5) TSH (TSH)   FastTSH                   2.41 uIU/mL                 0.35-5.50  Tests: (6) UDip w/Micro (URINE)   Color                     LT. YELLOW       RANGE:  Yellow;Lt. Yellow   Clarity                   CLEAR                       Clear   Specific Gravity          1.010                       1.000 - 1.030   Urine Ph                  5.0                         5.0-8.0   Protein                   NEGATIVE                    Negative   Urine Glucose             NEGATIVE                    Negative   Ketones                   NEGATIVE                    Negative   Urine Bilirubin           NEGATIVE                    Negative   Blood                     NEGATIVE                    Negative   Urobilinogen              0.2                         0.0 - 1.0   Leukocyte Esterace  TRACE                       Negative   Nitrite                    NEGATIVE                    Negative   Urine WBC                 0-2/hpf                     0-2/hpf   Urine Epith               Rare(0-4/hpf)               Rare(0-4/hpf)  Tests: (7) Cholesterol LDL - Direct (DIRLDL)  Cholesterol LDL - Direct                             156.7 mg/dL     Optimal:  <161 mg/dL     Near or Above Optimal:  100-129 mg/dL     Borderline High:  096-045 mg/dL     High:  409-811 mg/dL     Very High:  >914 mg/dL  Immunizations Administered:  Tetanus Vaccine:    Vaccine Type: Tdap    Site: left deltoid    Mfr: GlaxoSmithKline    Dose: 0.5 ml    Route: IM    Given by: Ami Bullins CMA    Exp. Date: 11/04/2011    Lot #: [id number]    VIS given: 01/01/08 version given October 25, 2009.

## 2010-03-17 NOTE — Letter (Signed)
Summary: [facility] Instructions  [facility]  [address] [place], [place] [address]   Phone: [phone]  Fax: [phone]       Ana Guzman    24-Nov-1945    MRN: [id number]       Procedure Day /Date: Tuesday 03-01-10      Arrival Time:  7:30 am     Procedure Time: 8:00 am     Location of Procedure:                    _x _  [facility] (4th Floor)   PREPARATION FOR COLONOSCOPY WITH MIRALAX  Starting 5 days prior to your procedure  02-24-10  do not eat nuts, seeds, popcorn, corn, beans, peas,  salads, or any raw vegetables.  Do not take any fiber supplements (e.g. Metamucil, Citrucel, and Benefiber). ____________________________________________________________________________________________________   THE DAY BEFORE YOUR PROCEDURE         DATE:  02-28-10   DAY: Monday  1   Drink clear liquids the entire day-NO SOLID FOOD  2   Do not drink anything colored red or purple.  Avoid juices with pulp.  No orange juice.  3   Drink at least 64 oz. (8 glasses) of fluid/clear liquids during the day to prevent dehydration and help the prep work efficiently.  CLEAR LIQUIDS INCLUDE: Water Jello Ice Popsicles Tea (sugar ok, no milk/cream) Powdered fruit flavored drinks Coffee (sugar ok, no milk/cream) Gatorade Juice: apple, white grape, white cranberry  Lemonade Clear bullion, consomm, broth Carbonated beverages (any kind) Strained chicken noodle soup Hard Candy  4   Mix the entire bottle of Miralax with 64 oz. of Gatorade/Powerade in the morning and put in the refrigerator to chill.  5   At 3:00 pm take 2 Dulcolax/Bisacodyl tablets.  6   At 4:30 pm take one Reglan/Metoclopramide tablet.  7  Starting at 5:00 pm drink one 8 oz glass of the Miralax mixture every 15-20 minutes until you have finished drinking the entire 64 oz.  You should finish drinking prep around 7:30 or 8:00 pm.  8   If you are nauseated, you may take the 2nd Reglan/Metoclopramide  tablet at 6:30 pm.        9    At 8:00 pm take 2 more DULCOLAX/Bisacodyl tablets.     THE DAY OF YOUR PROCEDURE      DATE:   03-01-10   DAY: Tuesday  You may drink clear liquids until  6:00 a.m.  (2 HOURS BEFORE PROCEDURE).   MEDICATION INSTRUCTIONS  Unless otherwise instructed, you should take regular prescription medications with a small sip of water as early as possible the morning of your procedure.        OTHER INSTRUCTIONS  You will need a responsible adult at least 63 years of age to accompany you and drive you home.   This person must remain in the waiting room during your procedure.  Wear loose fitting clothing that is easily removed.  Leave jewelry and other valuables at home.  However, you may wish to bring a book to read or an iPod/MP3 player to listen to music as you wait for your procedure to start.  Remove all body piercing jewelry and leave at home.  Total time from sign-in until discharge is approximately 2-3 hours.  You should go home directly after your procedure and rest.  You can resume normal activities the day after your procedure.  The day of your procedure  you should not:   Drive   Make legal decisions   Operate machinery   Drink alcohol   Return to work  You will receive specific instructions about eating, activities and medications before you leave.   The above instructions have been reviewed and explained to me by   Karl Bales RN  February 21, 2010 4:22 PM    I fully understand and can verbalize these instructions _____________________________ Date _______

## 2010-03-17 NOTE — Progress Notes (Signed)
Summary: speak to Ana Guzman  Phone Note Call from Patient Call back at [phone]   Caller: Patient Call For: Dr Juanda Chance Reason for Call: Talk to Nurse Summary of Call: Speak to [facility] regarding letter she received before coming to see Pre Visit. Initial call taken by: Tawni Levy,  November 01, 2009 9:05 AM  Follow-up for Phone Call        Dr Juanda Chance- Patient has a colonoscopy scheduled for 12/10/09. She called to let us know that she has had problems with sedation in the past. She states that she had problems waking up from Wisdom Teeth Extraction over 30 years ago and was told that it took her a substantially longer amount of time to wake up than most individuals. She does not recall the name of the physician or practice that preformed the wisdom teeth extraction so I am unable to find out what medication she had to sedate her. Just an FYI before you sedate! I will send you a reminder flag pm before procedure too. Follow-up by: Lamona Curl CMA Duncan Dull),  November 01, 2009 11:14 AM  Additional Follow-up for Phone Call Additional follow up Details #1::        Please schedule with Propafol. Additional Follow-up by: Hart Carwin MD,  November 01, 2009 1:37 PM    Additional Follow-up for Phone Call Additional follow up Details #2::    Patient has been advised that Dr Juanda Chance would prefer that she have propafol sedation for her colonoscopy rather than our typical sedation. I have offered the patient some dates that Dr Juanda Chance would be available to do her colonoscopy with propofol, however patient states that she is unsure if she can do the date. She wants to call back after she looks at her calender. She says she actually may try to wait until the beginning of the year as she is not having any GI problems. I will cancel patient's colonoscopy and previsit for now and she has been advised that when she calls back, she needs to ask for me.  Follow-up by: Lamona Curl CMA Duncan Dull),   November 01, 2009 2:04 PM   Appended Document: speak to [name] I have spoken to patient and scheduled her for previsit and colonoscopy with propofol on 03/01/10.

## 2010-03-17 NOTE — Miscellaneous (Signed)
Summary: BONE DENSITY  Clinical Lists Changes  Orders: Added new Test order of T-Bone Densitometry (77080) - Signed Added new Test order of T-Lumbar Vertebral Assessment (77082) - Signed 

## 2010-03-17 NOTE — Procedures (Signed)
Summary: Colonoscopy  Patient: [name] Note: All result statuses are Final unless otherwise noted.  Tests: (1) Colonoscopy (COL)   COL Colonoscopy           DONE     [facility]     [address]     [place], [place]  [address]           COLONOSCOPY PROCEDURE REPORT           PATIENT:  Ana Guzman, Ana Guzman  MR#:  [id number]     BIRTHDATE:  [birth year], 64 yrs. old  GENDER:  female     ENDOSCOPIST:  Hedwig Morton. Juanda Chance, MD     REF. BY:  Rosalyn Gess. Norins, M.D.     PROCEDURE DATE:  03/01/2010     PROCEDURE:  Colonoscopy 19147     ASA CLASS:  Class I     INDICATIONS:  Routine Risk Screening     MEDICATIONS:   propofol (Diprivan) 170 mg IV           DESCRIPTION OF PROCEDURE:   After the risks benefits and     alternatives of the procedure were thoroughly explained, informed     consent was obtained.  Digital rectal exam was performed and     revealed no rectal masses.   The [id number] endoscope was     introduced through the anus and advanced to the cecum, which was     identified by both the appendix and ileocecal valve, without     limitations.  The quality of the prep was good, using MiraLax.     The instrument was then slowly withdrawn as the colon was fully     examined.     <<PROCEDUREIMAGES>>           FINDINGS:  No polyps or cancers were seen (see image1, image2,     image3, and image4).   Retroflexed views in the rectum revealed no     abnormalities.    The scope was then withdrawn from the patient     and the procedure completed.           COMPLICATIONS:  None     ENDOSCOPIC IMPRESSION:     1) No polyps or cancers     2) Normal colonoscopy     RECOMMENDATIONS:     1) high fiber diet     REPEAT EXAM:  In 10 year(s) for.           ______________________________     Hedwig Morton. Juanda Chance, MD           CC:  Rosalyn Gess. Norins, M.D.           n.     eSIGNED:   Hedwig Morton. Ingri Diemer at 03/01/2010 08:27 AM           [name], [phone]  Note: An exclamation  mark (!) indicates a result that was not dispersed into the flowsheet. Document Creation Date: 03/01/2010 8:27 AM _______________________________________________________________________  (1) Order result status: Final Collection or observation date-time: 03/01/2010 08:14 Requested date-time:  Receipt date-time:  Reported date-time:  Referring Physician:   Ordering Physician: Lina Sar [id number]) Specimen Source:  Source: [name] Order Number: [id number] Lab site:   Appended Document: Colonoscopy    Clinical Lists Changes  Observations: Added new observation of COLONNXTDUE: 02/2020 (03/01/2010 10:39)

## 2010-11-07 ENCOUNTER — Other Ambulatory Visit: Payer: Self-pay | Admitting: Internal Medicine

## 2010-11-07 DIAGNOSIS — E781 Pure hyperglyceridemia: Secondary | ICD-10-CM

## 2010-11-07 DIAGNOSIS — Z0000 Encounter for general adult medical examination without abnormal findings: Secondary | ICD-10-CM

## 2010-11-08 ENCOUNTER — Other Ambulatory Visit: Payer: Self-pay | Admitting: Internal Medicine

## 2010-11-08 ENCOUNTER — Other Ambulatory Visit (INDEPENDENT_AMBULATORY_CARE_PROVIDER_SITE_OTHER): Payer: Self-pay

## 2010-11-08 DIAGNOSIS — E781 Pure hyperglyceridemia: Secondary | ICD-10-CM

## 2010-11-08 DIAGNOSIS — Z0000 Encounter for general adult medical examination without abnormal findings: Secondary | ICD-10-CM

## 2010-11-08 LAB — COMPREHENSIVE METABOLIC PANEL
ALT: 27 U/L (ref 0–35)
Alkaline Phosphatase: 52 U/L (ref 39–117)
Sodium: 138 mEq/L (ref 135–145)
Total Bilirubin: 0.6 mg/dL (ref 0.3–1.2)
Total Protein: 7 g/dL (ref 6.0–8.3)

## 2010-11-08 LAB — CBC WITH DIFFERENTIAL/PLATELET
Basophils Absolute: 0 10*3/uL (ref 0.0–0.1)
Eosinophils Absolute: 0.3 10*3/uL (ref 0.0–0.7)
HCT: 38.1 % (ref 36.0–46.0)
Lymphs Abs: 3 10*3/uL (ref 0.7–4.0)
MCHC: 33 g/dL (ref 30.0–36.0)
MCV: 95.8 fl (ref 78.0–100.0)
Monocytes Absolute: 0.8 10*3/uL (ref 0.1–1.0)
Monocytes Relative: 10.9 % (ref 3.0–12.0)
Platelets: 317 10*3/uL (ref 150.0–400.0)
RDW: 13.4 % (ref 11.5–14.6)

## 2010-11-08 LAB — HEPATIC FUNCTION PANEL
Bilirubin, Direct: 0 mg/dL (ref 0.0–0.3)
Total Protein: 7 g/dL (ref 6.0–8.3)

## 2010-11-08 LAB — LIPID PANEL
HDL: 47.2 mg/dL (ref >39.00)
Total CHOL/HDL Ratio: 4
Triglycerides: 124 mg/dL (ref 0.0–149.0)

## 2010-11-11 ENCOUNTER — Encounter: Payer: Self-pay | Admitting: Internal Medicine

## 2011-02-24 ENCOUNTER — Ambulatory Visit (INDEPENDENT_AMBULATORY_CARE_PROVIDER_SITE_OTHER): Payer: BC Managed Care – PPO

## 2011-02-24 DIAGNOSIS — Z23 Encounter for immunization: Secondary | ICD-10-CM

## 2011-09-26 ENCOUNTER — Other Ambulatory Visit: Payer: Self-pay | Admitting: Internal Medicine

## 2011-09-26 DIAGNOSIS — Z1231 Encounter for screening mammogram for malignant neoplasm of breast: Secondary | ICD-10-CM

## 2011-10-13 ENCOUNTER — Ambulatory Visit
Admission: RE | Admit: 2011-10-13 | Discharge: 2011-10-13 | Disposition: A | Discharge status: Home / Self Care | Payer: BC Managed Care – PPO | Source: Ambulatory Visit | Attending: Internal Medicine | Admitting: Internal Medicine

## 2011-10-13 DIAGNOSIS — Z1231 Encounter for screening mammogram for malignant neoplasm of breast: Secondary | ICD-10-CM

## 2011-10-18 ENCOUNTER — Other Ambulatory Visit: Payer: Self-pay | Admitting: Internal Medicine

## 2011-10-18 DIAGNOSIS — R928 Other abnormal and inconclusive findings on diagnostic imaging of breast: Secondary | ICD-10-CM

## 2011-10-20 ENCOUNTER — Other Ambulatory Visit: Payer: Self-pay | Admitting: Internal Medicine

## 2011-10-20 ENCOUNTER — Ambulatory Visit
Admission: RE | Admit: 2011-10-20 | Discharge: 2011-10-20 | Disposition: A | Discharge status: Home / Self Care | Payer: BC Managed Care – PPO | Source: Ambulatory Visit | Attending: Internal Medicine | Admitting: Internal Medicine

## 2011-10-20 DIAGNOSIS — R928 Other abnormal and inconclusive findings on diagnostic imaging of breast: Secondary | ICD-10-CM

## 2011-10-23 ENCOUNTER — Other Ambulatory Visit (INDEPENDENT_AMBULATORY_CARE_PROVIDER_SITE_OTHER): Payer: BC Managed Care – PPO

## 2011-10-23 ENCOUNTER — Encounter: Payer: Self-pay | Admitting: Internal Medicine

## 2011-10-23 ENCOUNTER — Ambulatory Visit (INDEPENDENT_AMBULATORY_CARE_PROVIDER_SITE_OTHER): Payer: BC Managed Care – PPO | Admitting: Internal Medicine

## 2011-10-23 VITALS — BP 154/82 | HR 85 | Temp 98.3°F | Resp 16 | Ht 63.0 in | Wt 134.0 lb

## 2011-10-23 DIAGNOSIS — E781 Pure hyperglyceridemia: Secondary | ICD-10-CM

## 2011-10-23 DIAGNOSIS — Z0000 Encounter for general adult medical examination without abnormal findings: Secondary | ICD-10-CM

## 2011-10-23 LAB — COMPREHENSIVE METABOLIC PANEL
ALT: 26 U/L (ref 0–35)
Albumin: 4.3 g/dL (ref 3.5–5.2)
Alkaline Phosphatase: 58 U/L (ref 39–117)
CO2: 29 mEq/L (ref 19–32)
GFR: 72.42 mL/min (ref >60.00)
Glucose, Bld: 96 mg/dL (ref 70–99)
Potassium: 4.4 mEq/L (ref 3.5–5.1)
Sodium: 137 mEq/L (ref 135–145)
Total Protein: 7.5 g/dL (ref 6.0–8.3)

## 2011-10-23 LAB — LIPID PANEL
Cholesterol: 217 mg/dL — ABNORMAL HIGH (ref 0–200)
Total CHOL/HDL Ratio: 4

## 2011-10-23 NOTE — Progress Notes (Signed)
Subjective:    Patient ID: Ana Guzman, female    DOB: 1945/07/19, 66 y.o.   MRN: [id number]  HPI Ana Guzman presents for a general wellness exam. She has been healthy: no major, no surgery, no injury. She is feeling good. Had recent mammogram with a small abnormality - scheduled for U/S guided needle biopsy Sept 17th (two of 4 sisters with breast cancer).  No complaints.  Past Medical History  Diagnosis Date  . Asymptomatic postmenopausal status (age-related) (natural)   . Pure hyperglyceridemia   . Osteopenia    Past Surgical History  Procedure Date  . Wisdom tooth extraction   . G0p0    Family History  Problem Relation Age of Onset  . Diabetes Mother   . COPD Father   . Heart disease Father   . Stroke Father   . Hyperlipidemia Sister   . Cancer Sister     breast  . Cancer Sister     breast   History   Social History  . Marital Status: Married    Spouse Name: N/A    Number of Children: 2  . Years of Education: 16   Occupational History  . Horticulturist, commercial    Social History Main Topics  . Smoking status: Never Smoker   . Smokeless tobacco: Never Used  . Alcohol Use: Yes     rarely -less than once a month  . Drug Use: No  . Sexually Active: No   Other Topics Concern  . Not on file   Social History Narrative   [employer] 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 MARRIED 1973. 2 ADOPTED CHILDREN  SON  1982    DAUGHTER  1986. MARRIAGE IS IN [facility]. NO H/O ABUSE    No current outpatient prescriptions on file prior to visit.       Review of Systems Constitutional:  Negative for fever, chills, activity change and unexpected weight change.  HEENT:  Negative for hearing loss, ear pain, congestion, neck stiffness and postnasal drip. Negative for sore throat or swallowing problems. Negative for dental complaints.   Eyes: Negative for vision loss or change in visual acuity.  Respiratory: Negative for chest tightness  and wheezing. Negative for DOE.   Cardiovascular: Negative for chest pain or palpitations. No decreased exercise tolerance Gastrointestinal: No change in bowel habit. No bloating or gas. No reflux or indigestion Genitourinary: Negative for urgency, frequency, flank pain and difficulty urinating.  Musculoskeletal: Negative for myalgias, back pain, arthralgias and gait problem.  Neurological: Negative for dizziness, tremors, weakness and headaches.  Hematological: Negative for adenopathy.  Psychiatric/Behavioral: Negative for behavioral problems and dysphoric mood.       Objective:   Physical Exam Filed Vitals:   10/23/11 1457  BP: 154/82  Pulse: 85  Temp: 98.3 F (36.8 C)  Resp: 16   Wt Readings from Last 3 Encounters:  10/23/11 134 lb (60.782 kg)  10/25/09 136 lb (61.689 kg)  07/20/09 135 lb 12 oz (61.576 kg)   Gen'l: well nourished, well developed white woman in no distress HEENT - [place]/AT, EACs/TMs normal, oropharynx with native dentition in good condition, no buccal or palatal lesions, posterior pharynx clear, mucous membranes moist. C&S clear, PERRLA, fundi - normal Neck - supple, no thyromegaly Nodes- negative submental, cervical, supraclavicular regions Chest - no deformity, no CVAT Lungs - cleat without rales, wheezes. No increased work of breathing Breast - deferred to recent mammogram Cardiovascular - regular rate and rhythm,  quiet precordium, no murmurs, rubs or gallops, 2+ radial, and PT pulses. 1+ DP pulses Abdomen - BS+ x 4, no HSM, no guarding or rebound or tenderness Pelvic - deferred  Rectal - deferred  Extremities - no clubbing, cyanosis, edema or deformity.  Neuro - A&O x 3, CN II-XII normal, motor strength normal and equal, DTRs 2+ and symmetrical biceps, radial, and patellar tendons. Cerebellar - no tremor, no rigidity, fluid movement and normal gait. Derm - Head, neck, back, abdomen and extremities without suspicious lesions  Lab Results  Component Value  Date   WBC 7.2 11/08/2010   HGB 12.6 11/08/2010   HCT 38.1 11/08/2010   PLT 317.0 11/08/2010   GLUCOSE 96 10/23/2011   CHOL 217* 10/23/2011   TRIG 294.0* 10/23/2011   HDL 51.50 10/23/2011   LDLDIRECT 132.4 10/23/2011   LDLCALC 119* 11/08/2010   ALT 26 10/23/2011   AST 22 10/23/2011   NA 137 10/23/2011   K 4.4 10/23/2011   CL 99 10/23/2011   CREATININE 0.8 10/23/2011   BUN 11 10/23/2011   CO2 29 10/23/2011   TSH 1.95 11/08/2010          Assessment & Plan:

## 2011-10-24 NOTE — Assessment & Plan Note (Signed)
Lipid panel ordered - recommendations to follow.

## 2011-10-24 NOTE — Assessment & Plan Note (Addendum)
Interval history is negative for any illness, surgery or injury. Physical exam is normal. Previous labs reviewed and were normal but Insurance requires repeat Bmet and lipid which are pending*. She is current  With colorectal and breast cancer screening. Immunizations: tetanus is current, she declines at this time pneumonia and shingles vaccine.  * Lipid panel with LDL just above goal of 130 or lower - balanced by healthy HDL: no medical therapy indicated, low fat diet and regular exercise will suffice.   In summary - a very nice and healthy woman with a strong family history of breast cancer. She is scheduled for u/s guided needle biopsy of a small breast lesion Sept 17th. She will return on an as needed base or in 1 year. At her next exam she is due for pelvic/PAP for the last time.

## 2011-10-31 ENCOUNTER — Ambulatory Visit
Admission: RE | Admit: 2011-10-31 | Discharge: 2011-10-31 | Disposition: A | Discharge status: Home / Self Care | Payer: BC Managed Care – PPO | Source: Ambulatory Visit | Attending: Internal Medicine | Admitting: Internal Medicine

## 2011-10-31 DIAGNOSIS — R928 Other abnormal and inconclusive findings on diagnostic imaging of breast: Secondary | ICD-10-CM

## 2012-02-14 HISTORY — PX: CATARACT EXTRACTION, BILATERAL: SHX1313

## 2012-04-10 ENCOUNTER — Other Ambulatory Visit: Payer: Self-pay | Admitting: Dermatology

## 2012-05-08 ENCOUNTER — Other Ambulatory Visit: Payer: Self-pay | Admitting: Dermatology

## 2012-10-07 ENCOUNTER — Other Ambulatory Visit: Payer: Self-pay | Admitting: Internal Medicine

## 2012-10-07 DIAGNOSIS — N63 Unspecified lump in unspecified breast: Secondary | ICD-10-CM

## 2012-11-15 ENCOUNTER — Ambulatory Visit
Admission: RE | Admit: 2012-11-15 | Discharge: 2012-11-15 | Disposition: A | Discharge status: Home / Self Care | Payer: Medicare Other | Source: Ambulatory Visit | Attending: Internal Medicine | Admitting: Internal Medicine

## 2012-11-15 DIAGNOSIS — N63 Unspecified lump in unspecified breast: Secondary | ICD-10-CM

## 2013-04-09 ENCOUNTER — Other Ambulatory Visit: Payer: Self-pay | Admitting: Dermatology

## 2013-06-02 ENCOUNTER — Encounter: Payer: Self-pay | Admitting: Internal Medicine

## 2013-06-02 ENCOUNTER — Ambulatory Visit (INDEPENDENT_AMBULATORY_CARE_PROVIDER_SITE_OTHER): Payer: Medicare Other | Admitting: Internal Medicine

## 2013-06-02 VITALS — BP 146/84 | HR 72 | Temp 97.9°F | Ht 63.5 in | Wt 136.0 lb

## 2013-06-02 DIAGNOSIS — M899 Disorder of bone, unspecified: Secondary | ICD-10-CM

## 2013-06-02 DIAGNOSIS — M949 Disorder of cartilage, unspecified: Principal | ICD-10-CM

## 2013-06-02 DIAGNOSIS — Z23 Encounter for immunization: Secondary | ICD-10-CM

## 2013-06-02 DIAGNOSIS — E781 Pure hyperglyceridemia: Secondary | ICD-10-CM

## 2013-06-02 DIAGNOSIS — R030 Elevated blood-pressure reading, without diagnosis of hypertension: Secondary | ICD-10-CM

## 2013-06-02 NOTE — Progress Notes (Signed)
Pre visit review using our clinic review tool, if applicable. No additional management support is needed unless otherwise documented below in the visit note. 

## 2013-06-02 NOTE — Assessment & Plan Note (Signed)
Patient reports home blood pressure readings lower. Patient advised to keep blood pressure log over the next 4 to 8 weeks.  Low sodium diet and regular aerobic exercise encouraged. She has history of dyslipidemia. Repeat screening labs including high sensitivity CRP.

## 2013-06-02 NOTE — Assessment & Plan Note (Signed)
Arrange repeat FLP.  Check HS CRP.

## 2013-06-02 NOTE — Progress Notes (Signed)
Subjective:    Patient ID: Ana Guzman, female    DOB: [birth year], 68 y.o.   MRN: [id number]  HPI  68 year old white female with history of dyslipidemia and osteopenia to transfer care. Patient previously seen by Dr. Linda Hedges.  Patient denies any significant interval medical history. Her medical records reviewed. Her last colonoscopy completed in January of 2012. No polyps were cancer was seen.  Social history reviewed. She works part-time as an Futures trader. She has been married for last 34 years.  Osteopenia - No family history of osteoporotic fracture.Her last bone density was in 2003. Her T score of lumbar spine was -1.9.  She reports sporadic elevated BP readings.  Her previous blood work from 2013 reviewed.  Review of Systems Negative for chest pain,  Occasional "esophageal spasm" when eating salad with vinaigrette dressing    Past Medical History  Diagnosis Date  . Asymptomatic postmenopausal status (age-related) (natural)   . Pure hyperglyceridemia   . Osteopenia     History   Social History  . Marital Status: Married    Spouse Name: N/A    Number of Children: 2  . Years of Education: 16   Occupational History  . Hospital doctor    Social History Main Topics  . Smoking status: Never Smoker   . Smokeless tobacco: Never Used  . Alcohol Use: Yes     Comment: rarely -less than once a month  . Drug Use: No  . Sexual Activity: No   Other Topics Concern  . Not on file   Social History Narrative   [employer]. 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 MARRIAGE IS IN [facility]. NO H/O ABUSE          Past Surgical History  Procedure Laterality Date  . Wisdom tooth extraction    . G0p0      Family History  Problem Relation Age of Onset  . Diabetes Mother   . COPD Father   . Heart disease Father   . Stroke Father   . Hyperlipidemia Sister   . Cancer  Sister     breast  . Cancer Sister     breast    No Known Allergies  No current outpatient prescriptions on file prior to visit.   No current facility-administered medications on file prior to visit.    BP 146/84  Pulse 72  Temp(Src) 97.9 F (36.6 C) (Oral)  Ht 5' 3.5" (1.613 m)  Wt 136 lb (61.689 kg)  BMI 23.71 kg/m2    Objective:   Physical Exam  Constitutional: She is oriented to person, place, and time. She appears well-developed and well-nourished. No distress.  HENT:  Head: Normocephalic and atraumatic.  Right Ear: External ear normal.  Left Ear: External ear normal.  Mouth/Throat: Oropharynx is clear and moist.  Eyes: EOM are normal. Pupils are equal, round, and reactive to light.  Neck: Neck supple.  Cardiovascular: Normal rate, regular rhythm and normal heart sounds.   No murmur heard. Pulmonary/Chest: Effort normal and breath sounds normal. She has no wheezes.  Abdominal: Soft. Bowel sounds are normal. There is no tenderness.  Musculoskeletal: She exhibits no edema.  Lymphadenopathy:    She has no cervical adenopathy.  Neurological: She is alert and oriented to person, place, and time. No cranial nerve deficit.  Skin: Skin is warm.  Psychiatric: She has a normal  mood and affect. Her behavior is normal.          Assessment & Plan:

## 2013-06-02 NOTE — Assessment & Plan Note (Signed)
Patient has history of osteopenia. She is due for repeat bone density scan. Obtain vitamin D levels.

## 2013-06-02 NOTE — Patient Instructions (Addendum)
Monitor your blood pressure 3 to 4 times per week as directed Limit your sodium intake to 3000 grams per day Regular aerobic exercise can help lower your blood pressure Please complete the following lab tests before your next follow up appointment: CPX labs including HS CRP Vitamin D level - 733.90

## 2013-06-04 ENCOUNTER — Ambulatory Visit (INDEPENDENT_AMBULATORY_CARE_PROVIDER_SITE_OTHER)
Admission: RE | Admit: 2013-06-04 | Discharge: 2013-06-04 | Disposition: A | Discharge status: Home / Self Care | Payer: Medicare Other | Source: Ambulatory Visit | Attending: Internal Medicine | Admitting: Internal Medicine

## 2013-06-04 DIAGNOSIS — M949 Disorder of cartilage, unspecified: Principal | ICD-10-CM

## 2013-06-04 DIAGNOSIS — M899 Disorder of bone, unspecified: Secondary | ICD-10-CM

## 2013-09-01 ENCOUNTER — Other Ambulatory Visit: Payer: Medicare Other

## 2013-09-05 ENCOUNTER — Encounter: Payer: Medicare Other | Admitting: Internal Medicine

## 2013-09-12 ENCOUNTER — Ambulatory Visit: Payer: Medicare Other

## 2013-09-12 DIAGNOSIS — Z0000 Encounter for general adult medical examination without abnormal findings: Secondary | ICD-10-CM

## 2013-09-12 LAB — CBC WITH DIFFERENTIAL/PLATELET
BASOS ABS: 0 10*3/uL (ref 0.0–0.1)
Basophils Relative: 0.5 % (ref 0.0–3.0)
EOS PCT: 8.2 % — AB (ref 0.0–5.0)
Eosinophils Absolute: 0.7 10*3/uL (ref 0.0–0.7)
HEMATOCRIT: 41 % (ref 36.0–46.0)
Hemoglobin: 13.6 g/dL (ref 12.0–15.0)
LYMPHS ABS: 3.1 10*3/uL (ref 0.7–4.0)
LYMPHS PCT: 37 % (ref 12.0–46.0)
MCHC: 33.1 g/dL (ref 30.0–36.0)
MCV: 93.4 fl (ref 78.0–100.0)
MONOS PCT: 9.2 % (ref 3.0–12.0)
Monocytes Absolute: 0.8 10*3/uL (ref 0.1–1.0)
Neutro Abs: 3.7 10*3/uL (ref 1.4–7.7)
Neutrophils Relative %: 45.1 % (ref 43.0–77.0)
Platelets: 354 10*3/uL (ref 150.0–400.0)
RBC: 4.38 Mil/uL (ref 3.87–5.11)
RDW: 13 % (ref 11.5–15.5)
WBC: 8.3 10*3/uL (ref 4.0–10.5)

## 2013-09-12 LAB — URINALYSIS, ROUTINE W REFLEX MICROSCOPIC
BILIRUBIN URINE: NEGATIVE
HGB URINE DIPSTICK: NEGATIVE
Ketones, ur: NEGATIVE
Nitrite: NEGATIVE
RBC / HPF: NONE SEEN (ref 0–?)
Specific Gravity, Urine: 1.01 (ref 1.000–1.030)
Total Protein, Urine: NEGATIVE
URINE GLUCOSE: NEGATIVE
UROBILINOGEN UA: 0.2 (ref 0.0–1.0)
pH: 7.5 (ref 5.0–8.0)

## 2013-09-12 LAB — BASIC METABOLIC PANEL
BUN: 13 mg/dL (ref 6–23)
CALCIUM: 9.7 mg/dL (ref 8.4–10.5)
CO2: 29 mEq/L (ref 19–32)
Chloride: 99 mEq/L (ref 96–112)
Creatinine, Ser: 0.8 mg/dL (ref 0.4–1.2)
GFR: 76.17 mL/min (ref >60.00)
GLUCOSE: 87 mg/dL (ref 70–99)
POTASSIUM: 4.4 meq/L (ref 3.5–5.1)
SODIUM: 136 meq/L (ref 135–145)

## 2013-09-12 LAB — HEPATIC FUNCTION PANEL
ALK PHOS: 53 U/L (ref 39–117)
ALT: 25 U/L (ref 0–35)
AST: 26 U/L (ref 0–37)
Albumin: 4.4 g/dL (ref 3.5–5.2)
BILIRUBIN DIRECT: 0.1 mg/dL (ref 0.0–0.3)
Total Bilirubin: 0.8 mg/dL (ref 0.2–1.2)
Total Protein: 7.4 g/dL (ref 6.0–8.3)

## 2013-09-12 LAB — LIPID PANEL
CHOL/HDL RATIO: 3
Cholesterol: 186 mg/dL (ref 0–200)
HDL: 55 mg/dL (ref >39.00)
LDL CALC: 106 mg/dL — AB (ref 0–99)
NonHDL: 131
Triglycerides: 123 mg/dL (ref 0.0–149.0)
VLDL: 24.6 mg/dL (ref 0.0–40.0)

## 2013-09-12 LAB — TSH: TSH: 1.06 u[IU]/mL (ref 0.35–4.50)

## 2013-09-24 ENCOUNTER — Encounter: Payer: Medicare Other | Admitting: Internal Medicine

## 2013-09-25 ENCOUNTER — Encounter: Payer: Medicare Other | Admitting: Internal Medicine

## 2013-10-14 ENCOUNTER — Ambulatory Visit (INDEPENDENT_AMBULATORY_CARE_PROVIDER_SITE_OTHER): Payer: Medicare Other | Admitting: Internal Medicine

## 2013-10-14 ENCOUNTER — Encounter: Payer: Self-pay | Admitting: Internal Medicine

## 2013-10-14 VITALS — BP 169/81 | HR 80 | Temp 98.1°F | Wt 130.0 lb

## 2013-10-14 DIAGNOSIS — M949 Disorder of cartilage, unspecified: Secondary | ICD-10-CM

## 2013-10-14 DIAGNOSIS — M899 Disorder of bone, unspecified: Secondary | ICD-10-CM

## 2013-10-14 DIAGNOSIS — Z23 Encounter for immunization: Secondary | ICD-10-CM

## 2013-10-14 DIAGNOSIS — R030 Elevated blood-pressure reading, without diagnosis of hypertension: Secondary | ICD-10-CM

## 2013-10-14 NOTE — Patient Instructions (Signed)
Schedule next visit as medicare physical

## 2013-10-14 NOTE — Assessment & Plan Note (Signed)
Home blood pressure readings are normal. Patient encouraged to follow healthy diet and engage in regular aerobic exercise. Continue to monitor blood pressure at home. She likely has whitecoat hypertension.  BP: 169/81 mmHg

## 2013-10-14 NOTE — Assessment & Plan Note (Signed)
Bone density scan from April 2015 showed worsening T score of lumbar spine of -2.4. T score of Right femoral neck is -1.8 left femoral neck -1.7.  Her 10 year probability of major osteoporotic fractures is 9.4% and hip fractures 1.4%.  Patient elects to continue calcium and vitamin D supplementation along with weightbearing exercises.

## 2013-10-14 NOTE — Progress Notes (Signed)
   Subjective:    Patient ID: Ana Guzman, female    DOB: 10/26/1945, 68 y.o.   MRN: [id number]  HPI  68 year old white female with history of osteopenia and elevated blood pressure readings without diagnosis of hypertension for routine followup. Patient has been monitoring her blood pressure at home. Her average systolic blood pressures in the low 676 and diastolic blood pressures in the mid to high 70s.  Patient had repeat bone density scan on 06/05/2013. T score of lumbar spine worsened to -2.4, right femoral neck -1.8, left femoral neck -1.7   Review of Systems Negative for significant weight change.  No family history of osteoporotic fracture    Past Medical History  Diagnosis Date  . Asymptomatic postmenopausal status (age-related) (natural)   . Pure hyperglyceridemia   . Osteopenia     History   Social History  . Marital Status: Married    Spouse Name: N/A    Number of Children: 2  . Years of Education: 16   Occupational History  . Hospital doctor    Social History Main Topics  . Smoking status: Never Smoker   . Smokeless tobacco: Never Used  . Alcohol Use: Yes     Comment: rarely -less than once a month  . Drug Use: No  . Sexual Activity: No   Other Topics Concern  . Not on file   Social History Narrative   [employer]. 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 [place]    DAUGHTER  1986. MARRIAGE IS IN [facility]. NO H/O ABUSE             Past Surgical History  Procedure Laterality Date  . Wisdom tooth extraction    . G0p0    . Cataract extraction, bilateral  2014    Family History  Problem Relation Age of Onset  . Diabetes Mother   . COPD Father   . Heart disease Father   . Stroke Father   . Hyperlipidemia Sister   . Cancer Sister     breast  . Cancer Sister     breast    No Known Allergies  No current outpatient prescriptions on  file prior to visit.   No current facility-administered medications on file prior to visit.    BP 169/81  Pulse 80  Temp(Src) 98.1 F (36.7 C)  Wt 130 lb (58.968 kg)    Objective:   Physical Exam  Constitutional: She is oriented to person, place, and time. She appears well-developed and well-nourished. No distress.  HENT:  Head: Normocephalic and atraumatic.  Cardiovascular: Normal rate, regular rhythm and normal heart sounds.   No murmur heard. Pulmonary/Chest: Effort normal and breath sounds normal. She has no wheezes.  Musculoskeletal: She exhibits no edema.  Neurological: She is alert and oriented to person, place, and time. No cranial nerve deficit.  Psychiatric: She has a normal mood and affect. Her behavior is normal.          Assessment & Plan:

## 2013-10-31 ENCOUNTER — Other Ambulatory Visit: Payer: Self-pay

## 2013-10-31 DIAGNOSIS — Z1231 Encounter for screening mammogram for malignant neoplasm of breast: Secondary | ICD-10-CM

## 2013-11-17 ENCOUNTER — Ambulatory Visit
Admission: RE | Admit: 2013-11-17 | Discharge: 2013-11-17 | Disposition: A | Discharge status: Home / Self Care | Payer: Medicare Other | Source: Ambulatory Visit

## 2013-11-17 DIAGNOSIS — Z1231 Encounter for screening mammogram for malignant neoplasm of breast: Secondary | ICD-10-CM

## 2014-01-16 ENCOUNTER — Ambulatory Visit (INDEPENDENT_AMBULATORY_CARE_PROVIDER_SITE_OTHER): Payer: Medicare Other | Admitting: Family Medicine

## 2014-01-16 ENCOUNTER — Encounter: Payer: Self-pay | Admitting: Family Medicine

## 2014-01-16 VITALS — BP 148/90 | Temp 98.7°F | Wt 128.0 lb

## 2014-01-16 DIAGNOSIS — J0100 Acute maxillary sinusitis, unspecified: Secondary | ICD-10-CM

## 2014-01-16 MED ORDER — METHYLPREDNISOLONE 4 MG PO KIT: PACK | ORAL | Status: AC

## 2014-01-16 MED ORDER — AZITHROMYCIN 250 MG PO TABS: ORAL_TABLET | ORAL | Status: DC

## 2014-01-16 NOTE — Progress Notes (Signed)
   Subjective:    Patient ID: Ana Guzman, female    DOB: 04-26-1945, 68 y.o.   MRN: [id number]  HPI Here for 5 days of sinus pressure, PND, ST, and a dry cough. No fever. On Claritin.    Review of Systems  Constitutional: Negative.   HENT: Positive for congestion, postnasal drip and sinus pressure.   Eyes: Negative.   Respiratory: Positive for cough.        Objective:   Physical Exam  Constitutional: She appears well-developed and well-nourished.  HENT:  Right Ear: External ear normal.  Left Ear: External ear normal.  Nose: Nose normal.  Mouth/Throat: Oropharynx is clear and moist.  Eyes: Conjunctivae are normal.  Pulmonary/Chest: Effort normal and breath sounds normal.  Lymphadenopathy:    She has no cervical adenopathy.          Assessment & Plan:  Recheck prn

## 2014-01-16 NOTE — Progress Notes (Signed)
Pre visit review using our clinic review tool, if applicable. No additional management support is needed unless otherwise documented below in the visit note. 

## 2014-05-28 DIAGNOSIS — L565 Disseminated superficial actinic porokeratosis (DSAP): Secondary | ICD-10-CM | POA: Diagnosis not present

## 2014-05-28 DIAGNOSIS — Z85828 Personal history of other malignant neoplasm of skin: Secondary | ICD-10-CM | POA: Diagnosis not present

## 2014-05-28 DIAGNOSIS — L570 Actinic keratosis: Secondary | ICD-10-CM | POA: Diagnosis not present

## 2014-05-28 DIAGNOSIS — L821 Other seborrheic keratosis: Secondary | ICD-10-CM | POA: Diagnosis not present

## 2014-05-28 DIAGNOSIS — B078 Other viral warts: Secondary | ICD-10-CM | POA: Diagnosis not present

## 2014-06-11 ENCOUNTER — Ambulatory Visit (INDEPENDENT_AMBULATORY_CARE_PROVIDER_SITE_OTHER): Payer: Medicare Other | Admitting: Adult Health

## 2014-06-11 ENCOUNTER — Encounter: Payer: Self-pay | Admitting: Adult Health

## 2014-06-11 VITALS — BP 150/88 | Temp 98.5°F | Wt 129.7 lb

## 2014-06-11 DIAGNOSIS — T180XXA Foreign body in mouth, initial encounter: Secondary | ICD-10-CM | POA: Diagnosis not present

## 2014-06-11 DIAGNOSIS — S00552A Superficial foreign body of oral cavity, initial encounter: Secondary | ICD-10-CM

## 2014-06-11 NOTE — Patient Instructions (Signed)
Monitor for signs and symptoms of infection. If you develop any, please let me know. It was great meeting you today.

## 2014-06-11 NOTE — Progress Notes (Signed)
   Subjective:    Patient ID: Ana Guzman, female    DOB: 11-21-1945, 69 y.o.   MRN: [id number]  HPI Patient was eating dinner last night- black beans ( canned) and chicken. When she was eating the black beans she felt as though something went into her tongue. This feeling continued into today. Very little pain, just discomfort.    Review of Systems  All other systems reviewed and are negative.      Objective:   Physical Exam  Constitutional: She appears well-developed and well-nourished.  HENT:  Small grey colored foreign body on right back tongue  Nursing note and vitals reviewed.      Assessment & Plan:  1. Foreign body of tongue, initial encounter -Removed a thin, about 3/8th inch piece of metal from the patients right back tongue. There were no signs or symptoms of infection.  - Follow up if she notices swelling, redness, pain, or pus.

## 2014-10-23 ENCOUNTER — Other Ambulatory Visit: Payer: Self-pay

## 2014-10-23 DIAGNOSIS — Z1231 Encounter for screening mammogram for malignant neoplasm of breast: Secondary | ICD-10-CM

## 2014-12-01 ENCOUNTER — Ambulatory Visit
Admission: RE | Admit: 2014-12-01 | Discharge: 2014-12-01 | Disposition: A | Discharge status: Home / Self Care | Payer: Medicare Other | Source: Ambulatory Visit

## 2014-12-01 DIAGNOSIS — Z1231 Encounter for screening mammogram for malignant neoplasm of breast: Secondary | ICD-10-CM

## 2015-02-26 DIAGNOSIS — J069 Acute upper respiratory infection, unspecified: Secondary | ICD-10-CM | POA: Diagnosis not present

## 2015-03-05 DIAGNOSIS — Z23 Encounter for immunization: Secondary | ICD-10-CM | POA: Diagnosis not present

## 2015-12-15 ENCOUNTER — Other Ambulatory Visit: Payer: Self-pay | Admitting: Family Medicine

## 2015-12-15 DIAGNOSIS — Z1231 Encounter for screening mammogram for malignant neoplasm of breast: Secondary | ICD-10-CM

## 2016-01-17 ENCOUNTER — Ambulatory Visit
Admission: RE | Admit: 2016-01-17 | Discharge: 2016-01-17 | Disposition: A | Discharge status: Home / Self Care | Payer: Medicare Other | Source: Ambulatory Visit | Attending: Family Medicine | Admitting: Family Medicine

## 2016-01-17 DIAGNOSIS — Z1231 Encounter for screening mammogram for malignant neoplasm of breast: Secondary | ICD-10-CM

## 2016-12-21 ENCOUNTER — Other Ambulatory Visit: Payer: Self-pay | Admitting: Family Medicine

## 2016-12-21 DIAGNOSIS — Z139 Encounter for screening, unspecified: Secondary | ICD-10-CM

## 2017-01-18 ENCOUNTER — Ambulatory Visit
Admission: RE | Admit: 2017-01-18 | Discharge: 2017-01-18 | Disposition: A | Discharge status: Home / Self Care | Payer: Medicare Other | Source: Ambulatory Visit | Attending: Family Medicine | Admitting: Family Medicine

## 2017-01-18 DIAGNOSIS — Z139 Encounter for screening, unspecified: Secondary | ICD-10-CM

## 2018-04-09 ENCOUNTER — Other Ambulatory Visit: Payer: Self-pay | Admitting: Family Medicine

## 2018-04-09 DIAGNOSIS — Z1231 Encounter for screening mammogram for malignant neoplasm of breast: Secondary | ICD-10-CM

## 2018-05-09 ENCOUNTER — Ambulatory Visit: Payer: Medicare Other

## 2018-07-03 ENCOUNTER — Ambulatory Visit
Admission: RE | Admit: 2018-07-03 | Discharge: 2018-07-03 | Disposition: A | Discharge status: Home / Self Care | Payer: Medicare Other | Source: Ambulatory Visit | Attending: Family Medicine | Admitting: Family Medicine

## 2018-07-03 ENCOUNTER — Other Ambulatory Visit: Payer: Self-pay

## 2018-07-03 DIAGNOSIS — Z1231 Encounter for screening mammogram for malignant neoplasm of breast: Secondary | ICD-10-CM

## 2018-07-11 ENCOUNTER — Ambulatory Visit: Payer: Medicare Other

## 2019-03-15 ENCOUNTER — Ambulatory Visit: Payer: Medicare Other

## 2019-03-20 ENCOUNTER — Ambulatory Visit: Payer: Medicare Other | Attending: Internal Medicine

## 2019-03-20 ENCOUNTER — Ambulatory Visit: Payer: Medicare Other

## 2019-03-20 DIAGNOSIS — Z23 Encounter for immunization: Secondary | ICD-10-CM | POA: Insufficient documentation

## 2019-03-20 NOTE — Progress Notes (Signed)
   Covid-19 Vaccination Clinic  Name:  Ana Guzman    MRN: [id number] DOB: August 26, 1945  03/20/2019  Ana Guzman was observed post Covid-19 immunization for 15 minutes without incidence. She was provided with Vaccine Information Sheet and instruction to access the V-Safe system.   Ana Guzman was instructed to call 911 with any severe reactions post vaccine: Marland Kitchen Difficulty breathing  . Swelling of your face and throat  . A fast heartbeat  . A bad rash all over your body  . Dizziness and weakness    Immunizations Administered    Name Date Dose VIS Date Route   Pfizer COVID-19 Vaccine 03/20/2019 10:26 AM 0.3 mL 01/24/2019 Intramuscular   Manufacturer: [place]   Lot: [id number]   [place]: [id number]

## 2019-04-14 ENCOUNTER — Ambulatory Visit: Payer: Medicare Other | Attending: Internal Medicine

## 2019-04-14 DIAGNOSIS — Z23 Encounter for immunization: Secondary | ICD-10-CM | POA: Insufficient documentation

## 2019-04-14 NOTE — Progress Notes (Signed)
   Covid-19 Vaccination Clinic  Name:  Lilikoi Gabelman    MRN: [id number] DOB: October 24, 1945  04/14/2019  Ms. Alsbury was observed post Covid-19 immunization for 15 minutes without incidence. She was provided with Vaccine Information Sheet and instruction to access the V-Safe system.   Ms. Trillo was instructed to call 911 with any severe reactions post vaccine: Marland Kitchen Difficulty breathing  . Swelling of your face and throat  . A fast heartbeat  . A bad rash all over your body  . Dizziness and weakness    Immunizations Administered    Name Date Dose VIS Date Route   Pfizer COVID-19 Vaccine 04/14/2019  2:48 PM 0.3 mL 01/24/2019 Intramuscular   Manufacturer: [place]   Lot: [id number]   [place]: [id number]

## 2019-06-06 ENCOUNTER — Other Ambulatory Visit: Payer: Self-pay | Admitting: Family Medicine

## 2019-06-06 DIAGNOSIS — Z1231 Encounter for screening mammogram for malignant neoplasm of breast: Secondary | ICD-10-CM

## 2019-07-08 ENCOUNTER — Ambulatory Visit
Admission: RE | Admit: 2019-07-08 | Discharge: 2019-07-08 | Disposition: A | Discharge status: Home / Self Care | Payer: Medicare Other | Source: Ambulatory Visit | Attending: Family Medicine | Admitting: Family Medicine

## 2019-07-08 ENCOUNTER — Other Ambulatory Visit: Payer: Self-pay

## 2019-07-08 DIAGNOSIS — Z1231 Encounter for screening mammogram for malignant neoplasm of breast: Secondary | ICD-10-CM

## 2020-04-29 DIAGNOSIS — L918 Other hypertrophic disorders of the skin: Secondary | ICD-10-CM | POA: Diagnosis not present

## 2020-04-29 DIAGNOSIS — D485 Neoplasm of uncertain behavior of skin: Secondary | ICD-10-CM | POA: Diagnosis not present

## 2020-04-29 DIAGNOSIS — Z85828 Personal history of other malignant neoplasm of skin: Secondary | ICD-10-CM | POA: Diagnosis not present

## 2020-04-29 DIAGNOSIS — L821 Other seborrheic keratosis: Secondary | ICD-10-CM | POA: Diagnosis not present

## 2020-04-29 DIAGNOSIS — D2261 Melanocytic nevi of right upper limb, including shoulder: Secondary | ICD-10-CM | POA: Diagnosis not present

## 2020-04-29 DIAGNOSIS — L308 Other specified dermatitis: Secondary | ICD-10-CM | POA: Diagnosis not present

## 2020-04-29 DIAGNOSIS — D2262 Melanocytic nevi of left upper limb, including shoulder: Secondary | ICD-10-CM | POA: Diagnosis not present

## 2020-04-29 DIAGNOSIS — D225 Melanocytic nevi of trunk: Secondary | ICD-10-CM | POA: Diagnosis not present

## 2020-04-29 DIAGNOSIS — L570 Actinic keratosis: Secondary | ICD-10-CM | POA: Diagnosis not present

## 2020-04-29 DIAGNOSIS — D1801 Hemangioma of skin and subcutaneous tissue: Secondary | ICD-10-CM | POA: Diagnosis not present

## 2020-04-29 DIAGNOSIS — L814 Other melanin hyperpigmentation: Secondary | ICD-10-CM | POA: Diagnosis not present

## 2020-06-03 ENCOUNTER — Encounter: Payer: Self-pay | Admitting: Gastroenterology

## 2020-11-11 ENCOUNTER — Other Ambulatory Visit: Payer: Self-pay | Admitting: Family Medicine

## 2020-11-11 DIAGNOSIS — Z1231 Encounter for screening mammogram for malignant neoplasm of breast: Secondary | ICD-10-CM

## 2020-11-29 DIAGNOSIS — Z23 Encounter for immunization: Secondary | ICD-10-CM | POA: Diagnosis not present

## 2020-12-02 DIAGNOSIS — D3191 Benign neoplasm of unspecified part of right eye: Secondary | ICD-10-CM | POA: Diagnosis not present

## 2020-12-02 DIAGNOSIS — Z961 Presence of intraocular lens: Secondary | ICD-10-CM | POA: Diagnosis not present

## 2020-12-13 ENCOUNTER — Other Ambulatory Visit: Payer: Self-pay

## 2020-12-13 ENCOUNTER — Ambulatory Visit
Admission: RE | Admit: 2020-12-13 | Discharge: 2020-12-13 | Disposition: A | Discharge status: Home / Self Care | Payer: Medicare Other | Source: Ambulatory Visit | Attending: Family Medicine | Admitting: Family Medicine

## 2020-12-13 DIAGNOSIS — Z1231 Encounter for screening mammogram for malignant neoplasm of breast: Secondary | ICD-10-CM | POA: Diagnosis not present

## 2021-01-19 DIAGNOSIS — E7800 Pure hypercholesterolemia, unspecified: Secondary | ICD-10-CM | POA: Diagnosis not present

## 2021-01-19 DIAGNOSIS — E559 Vitamin D deficiency, unspecified: Secondary | ICD-10-CM | POA: Diagnosis not present

## 2021-01-19 DIAGNOSIS — Z79899 Other long term (current) drug therapy: Secondary | ICD-10-CM | POA: Diagnosis not present

## 2021-01-20 DIAGNOSIS — M818 Other osteoporosis without current pathological fracture: Secondary | ICD-10-CM | POA: Diagnosis not present

## 2021-01-20 DIAGNOSIS — I10 Essential (primary) hypertension: Secondary | ICD-10-CM | POA: Diagnosis not present

## 2021-01-20 DIAGNOSIS — Z0000 Encounter for general adult medical examination without abnormal findings: Secondary | ICD-10-CM | POA: Diagnosis not present

## 2021-01-20 DIAGNOSIS — N182 Chronic kidney disease, stage 2 (mild): Secondary | ICD-10-CM | POA: Diagnosis not present

## 2021-01-20 DIAGNOSIS — I129 Hypertensive chronic kidney disease with stage 1 through stage 4 chronic kidney disease, or unspecified chronic kidney disease: Secondary | ICD-10-CM | POA: Diagnosis not present

## 2021-01-20 DIAGNOSIS — E7800 Pure hypercholesterolemia, unspecified: Secondary | ICD-10-CM | POA: Diagnosis not present

## 2021-01-20 DIAGNOSIS — E559 Vitamin D deficiency, unspecified: Secondary | ICD-10-CM | POA: Diagnosis not present

## 2021-01-20 DIAGNOSIS — D72829 Elevated white blood cell count, unspecified: Secondary | ICD-10-CM | POA: Diagnosis not present

## 2021-01-24 ENCOUNTER — Other Ambulatory Visit: Payer: Self-pay | Admitting: Family Medicine

## 2021-01-24 DIAGNOSIS — E2839 Other primary ovarian failure: Secondary | ICD-10-CM

## 2021-03-16 DIAGNOSIS — D72829 Elevated white blood cell count, unspecified: Secondary | ICD-10-CM | POA: Diagnosis not present

## 2021-05-02 DIAGNOSIS — Z85828 Personal history of other malignant neoplasm of skin: Secondary | ICD-10-CM | POA: Diagnosis not present

## 2021-05-02 DIAGNOSIS — L918 Other hypertrophic disorders of the skin: Secondary | ICD-10-CM | POA: Diagnosis not present

## 2021-05-02 DIAGNOSIS — L821 Other seborrheic keratosis: Secondary | ICD-10-CM | POA: Diagnosis not present

## 2021-08-09 ENCOUNTER — Ambulatory Visit
Admission: RE | Admit: 2021-08-09 | Discharge: 2021-08-09 | Disposition: A | Discharge status: Home / Self Care | Payer: Medicare Other | Source: Ambulatory Visit | Attending: Family Medicine | Admitting: Family Medicine

## 2021-08-09 DIAGNOSIS — E2839 Other primary ovarian failure: Secondary | ICD-10-CM

## 2021-08-09 DIAGNOSIS — M8589 Other specified disorders of bone density and structure, multiple sites: Secondary | ICD-10-CM | POA: Diagnosis not present

## 2021-08-09 DIAGNOSIS — Z780 Asymptomatic menopausal state: Secondary | ICD-10-CM | POA: Diagnosis not present

## 2021-11-15 ENCOUNTER — Other Ambulatory Visit: Payer: Self-pay | Admitting: Family Medicine

## 2021-11-15 DIAGNOSIS — Z1231 Encounter for screening mammogram for malignant neoplasm of breast: Secondary | ICD-10-CM

## 2021-11-25 DIAGNOSIS — Z23 Encounter for immunization: Secondary | ICD-10-CM | POA: Diagnosis not present

## 2021-12-13 DIAGNOSIS — D3191 Benign neoplasm of unspecified part of right eye: Secondary | ICD-10-CM | POA: Diagnosis not present

## 2021-12-13 DIAGNOSIS — Z961 Presence of intraocular lens: Secondary | ICD-10-CM | POA: Diagnosis not present

## 2021-12-15 ENCOUNTER — Ambulatory Visit: Payer: Medicare Other

## 2021-12-27 ENCOUNTER — Ambulatory Visit
Admission: RE | Admit: 2021-12-27 | Discharge: 2021-12-27 | Disposition: A | Discharge status: Home / Self Care | Payer: Medicare Other | Source: Ambulatory Visit | Attending: Family Medicine | Admitting: Family Medicine

## 2021-12-27 DIAGNOSIS — Z1231 Encounter for screening mammogram for malignant neoplasm of breast: Secondary | ICD-10-CM

## 2021-12-28 DIAGNOSIS — L820 Inflamed seborrheic keratosis: Secondary | ICD-10-CM | POA: Diagnosis not present

## 2022-03-20 DIAGNOSIS — E559 Vitamin D deficiency, unspecified: Secondary | ICD-10-CM | POA: Diagnosis not present

## 2022-03-20 DIAGNOSIS — I10 Essential (primary) hypertension: Secondary | ICD-10-CM | POA: Diagnosis not present

## 2022-03-20 DIAGNOSIS — Z79899 Other long term (current) drug therapy: Secondary | ICD-10-CM | POA: Diagnosis not present

## 2022-03-21 DIAGNOSIS — I129 Hypertensive chronic kidney disease with stage 1 through stage 4 chronic kidney disease, or unspecified chronic kidney disease: Secondary | ICD-10-CM | POA: Diagnosis not present

## 2022-03-21 DIAGNOSIS — Z0000 Encounter for general adult medical examination without abnormal findings: Secondary | ICD-10-CM | POA: Diagnosis not present

## 2022-03-21 DIAGNOSIS — E7800 Pure hypercholesterolemia, unspecified: Secondary | ICD-10-CM | POA: Diagnosis not present

## 2022-03-21 DIAGNOSIS — I10 Essential (primary) hypertension: Secondary | ICD-10-CM | POA: Diagnosis not present

## 2022-03-21 DIAGNOSIS — E559 Vitamin D deficiency, unspecified: Secondary | ICD-10-CM | POA: Diagnosis not present

## 2022-05-08 DIAGNOSIS — L905 Scar conditions and fibrosis of skin: Secondary | ICD-10-CM | POA: Diagnosis not present

## 2022-05-08 DIAGNOSIS — Z85828 Personal history of other malignant neoplasm of skin: Secondary | ICD-10-CM | POA: Diagnosis not present

## 2022-05-08 DIAGNOSIS — L821 Other seborrheic keratosis: Secondary | ICD-10-CM | POA: Diagnosis not present

## 2022-05-08 DIAGNOSIS — D225 Melanocytic nevi of trunk: Secondary | ICD-10-CM | POA: Diagnosis not present

## 2022-05-08 DIAGNOSIS — L570 Actinic keratosis: Secondary | ICD-10-CM | POA: Diagnosis not present

## 2022-06-22 ENCOUNTER — Ambulatory Visit: Payer: Medicare Other | Admitting: Sports Medicine

## 2022-06-22 ENCOUNTER — Ambulatory Visit
Admission: RE | Admit: 2022-06-22 | Discharge: 2022-06-22 | Disposition: A | Discharge status: Home / Self Care | Payer: Medicare Other | Source: Ambulatory Visit | Attending: Sports Medicine | Admitting: Sports Medicine

## 2022-06-22 VITALS — BP 138/84 | Ht 62.0 in | Wt 128.0 lb

## 2022-06-22 DIAGNOSIS — M5416 Radiculopathy, lumbar region: Secondary | ICD-10-CM | POA: Diagnosis not present

## 2022-06-22 DIAGNOSIS — M545 Low back pain, unspecified: Secondary | ICD-10-CM | POA: Diagnosis not present

## 2022-06-22 DIAGNOSIS — M79604 Pain in right leg: Secondary | ICD-10-CM | POA: Diagnosis not present

## 2022-06-22 DIAGNOSIS — M25561 Pain in right knee: Secondary | ICD-10-CM

## 2022-06-22 MED ORDER — PREDNISONE 10 MG PO TABS: ORAL_TABLET | ORAL | 0 refills | Status: DC

## 2022-06-22 MED ORDER — GABAPENTIN 300 MG PO CAPS: 300.0000 mg | ORAL_CAPSULE | Freq: Every day | ORAL | 2 refills | Status: DC

## 2022-06-22 MED ORDER — METHYLPREDNISOLONE ACETATE 80 MG/ML IJ SUSP
80.0000 mg | Freq: Once | INTRAMUSCULAR | Status: AC
  Administered 2022-06-22: 80 mg via INTRAMUSCULAR

## 2022-06-22 NOTE — Progress Notes (Signed)
   Subjective:    Patient ID: Ana Guzman, female    DOB: 1945-09-02, 77 y.o.   MRN: [id number]  HPI chief complaint: Right leg pain  Patient is a very pleasant 77 year old female that comes in today complaining of pain in her right leg.  She denies any trauma.  She describes a burning type of pain that is along the lateral and anterior thigh.  Although she currently denies any back pain, she did experience some back pain a few weeks ago with a new yoga class.  She has since discontinued that class.  The only other new activity is walking a new dog which they got 3 months ago.  Her pain does not radiate past the knee but it is very uncomfortable.  She has difficulty walking but finds comfort if she walks slightly flexed at the lumbar spine and with pressure over her distal thigh.  She denies numbness and tingling.  She does have pain at night.  Her symptoms tend to get worse as the day progresses.  She tried ibuprofen and Tylenol with minimal symptom relief.  She denies similar issues in the past.  No weakness.  Past medical history reviewed Medications reviewed Allergies reviewed    Review of Systems As above    Objective:   Physical Exam  Well-developed, well-nourished.  No acute distress  Lumbar spine: Full lumbar flexion without pain.  She does have pain when going from a flexed to an extended position.  There is no tenderness to palpation.  Right hip: Smooth painless hip range of motion with a negative logroll  Neurological exam: Patient has 5/5 strength in both lower extremities.  There is decreased sensation to light touch along the L3 and L4 dermatomes in the thigh.  She has an absent patellar reflex on the right.  1+ patellar reflex on the left.  Trace but equal reflexes at the Achilles tendons bilaterally.      Assessment & Plan:   Right leg pain-question lumbar radiculopathy  Although this is an atypical presentation, her complaint of numbness along with pain  coupled with her physical exam findings suggest lumbar radiculopathy likely originating from the L3 or L4 nerve root.  We'll inject her today with 80 mg of IM Depo-Medrol.  She will start a 6-day Sterapred Dosepak tomorrow.  I have also given her a prescription for gabapentin to take 300 mg at night as needed.  [facility]  We will also fit her with a compression sleeve for her right thigh since she does find compression to be more comfortable.  I have asked her to message me through MyChart early next week with a status update.  In the meantime, I am also going to order a lumbar spine x-ray and I will discuss those results with her during that update.  This note was dictated using Dragon naturally speaking software and may contain errors in syntax, spelling, or content which have not been identified prior to signing this note.

## 2022-06-25 ENCOUNTER — Encounter: Payer: Self-pay | Admitting: Sports Medicine

## 2022-06-26 DIAGNOSIS — M79669 Pain in unspecified lower leg: Secondary | ICD-10-CM | POA: Diagnosis not present

## 2022-06-29 ENCOUNTER — Encounter: Payer: Self-pay | Admitting: Sports Medicine

## 2022-07-04 ENCOUNTER — Other Ambulatory Visit: Payer: Self-pay

## 2022-07-04 DIAGNOSIS — M79669 Pain in unspecified lower leg: Secondary | ICD-10-CM | POA: Diagnosis not present

## 2022-07-04 MED ORDER — DICLOFENAC SODIUM 75 MG PO TBEC: 75.0000 mg | DELAYED_RELEASE_TABLET | Freq: Every day | ORAL | 0 refills | Status: DC | PRN

## 2022-07-11 DIAGNOSIS — M79669 Pain in unspecified lower leg: Secondary | ICD-10-CM | POA: Diagnosis not present

## 2022-07-17 ENCOUNTER — Other Ambulatory Visit: Payer: Self-pay

## 2022-07-17 DIAGNOSIS — M5416 Radiculopathy, lumbar region: Secondary | ICD-10-CM

## 2022-07-18 DIAGNOSIS — M79669 Pain in unspecified lower leg: Secondary | ICD-10-CM | POA: Diagnosis not present

## 2022-07-21 DIAGNOSIS — M79669 Pain in unspecified lower leg: Secondary | ICD-10-CM | POA: Diagnosis not present

## 2022-07-23 ENCOUNTER — Ambulatory Visit
Admission: RE | Admit: 2022-07-23 | Discharge: 2022-07-23 | Disposition: A | Discharge status: Home / Self Care | Payer: Medicare Other | Source: Ambulatory Visit | Attending: Sports Medicine | Admitting: Sports Medicine

## 2022-07-23 DIAGNOSIS — M48061 Spinal stenosis, lumbar region without neurogenic claudication: Secondary | ICD-10-CM | POA: Diagnosis not present

## 2022-07-23 DIAGNOSIS — M5126 Other intervertebral disc displacement, lumbar region: Secondary | ICD-10-CM | POA: Diagnosis not present

## 2022-07-23 DIAGNOSIS — M5416 Radiculopathy, lumbar region: Secondary | ICD-10-CM

## 2022-07-25 DIAGNOSIS — M79669 Pain in unspecified lower leg: Secondary | ICD-10-CM | POA: Diagnosis not present

## 2022-07-27 DIAGNOSIS — M79669 Pain in unspecified lower leg: Secondary | ICD-10-CM | POA: Diagnosis not present

## 2022-07-29 DIAGNOSIS — M79669 Pain in unspecified lower leg: Secondary | ICD-10-CM | POA: Diagnosis not present

## 2022-08-01 DIAGNOSIS — M79669 Pain in unspecified lower leg: Secondary | ICD-10-CM | POA: Diagnosis not present

## 2022-08-04 DIAGNOSIS — M79669 Pain in unspecified lower leg: Secondary | ICD-10-CM | POA: Diagnosis not present

## 2022-08-07 ENCOUNTER — Telehealth: Payer: Self-pay | Admitting: Sports Medicine

## 2022-08-07 NOTE — Telephone Encounter (Signed)
  I spoke with Ana Guzman on the phone today after reviewing MRI findings of her lumbar spine.  MRI does show a subarticular/foraminal disc protrusion at L3-L4 displacing the right L4 nerve root.  There is also an extraforaminal disc component in close proximity to the exiting right L3 nerve root.  This correlates with her clinical symptoms.  Fortunately, she is improving with the help of physical therapy.  She states that she has more good days than bad now.  We did discuss the possibility of a lumbar epidural steroid injection if symptoms once again worsen.  She will follow-up with me as needed.

## 2022-08-08 DIAGNOSIS — M79669 Pain in unspecified lower leg: Secondary | ICD-10-CM | POA: Diagnosis not present

## 2022-08-10 DIAGNOSIS — M79669 Pain in unspecified lower leg: Secondary | ICD-10-CM | POA: Diagnosis not present

## 2022-08-15 DIAGNOSIS — M79669 Pain in unspecified lower leg: Secondary | ICD-10-CM | POA: Diagnosis not present

## 2022-08-18 ENCOUNTER — Other Ambulatory Visit: Payer: Self-pay | Admitting: Sports Medicine

## 2022-08-18 DIAGNOSIS — M79669 Pain in unspecified lower leg: Secondary | ICD-10-CM | POA: Diagnosis not present

## 2022-08-22 DIAGNOSIS — M79669 Pain in unspecified lower leg: Secondary | ICD-10-CM | POA: Diagnosis not present

## 2022-08-25 DIAGNOSIS — M79669 Pain in unspecified lower leg: Secondary | ICD-10-CM | POA: Diagnosis not present

## 2022-08-27 ENCOUNTER — Encounter: Payer: Self-pay | Admitting: Sports Medicine

## 2022-08-29 DIAGNOSIS — M79669 Pain in unspecified lower leg: Secondary | ICD-10-CM | POA: Diagnosis not present

## 2022-08-31 DIAGNOSIS — M79669 Pain in unspecified lower leg: Secondary | ICD-10-CM | POA: Diagnosis not present

## 2022-09-05 DIAGNOSIS — M79669 Pain in unspecified lower leg: Secondary | ICD-10-CM | POA: Diagnosis not present

## 2022-09-13 ENCOUNTER — Other Ambulatory Visit: Payer: Self-pay | Admitting: *Deleted

## 2022-09-13 DIAGNOSIS — M79669 Pain in unspecified lower leg: Secondary | ICD-10-CM | POA: Diagnosis not present

## 2022-09-13 MED ORDER — DICLOFENAC SODIUM 75 MG PO TBEC: 75.0000 mg | DELAYED_RELEASE_TABLET | Freq: Every day | ORAL | 0 refills | Status: DC | PRN

## 2022-09-18 DIAGNOSIS — M79669 Pain in unspecified lower leg: Secondary | ICD-10-CM | POA: Diagnosis not present

## 2022-09-19 DIAGNOSIS — M79669 Pain in unspecified lower leg: Secondary | ICD-10-CM | POA: Diagnosis not present

## 2022-09-27 DIAGNOSIS — M79669 Pain in unspecified lower leg: Secondary | ICD-10-CM | POA: Diagnosis not present

## 2022-09-29 DIAGNOSIS — M79669 Pain in unspecified lower leg: Secondary | ICD-10-CM | POA: Diagnosis not present

## 2022-10-02 DIAGNOSIS — M79669 Pain in unspecified lower leg: Secondary | ICD-10-CM | POA: Diagnosis not present

## 2022-10-11 DIAGNOSIS — M79669 Pain in unspecified lower leg: Secondary | ICD-10-CM | POA: Diagnosis not present

## 2022-10-25 DIAGNOSIS — M79669 Pain in unspecified lower leg: Secondary | ICD-10-CM | POA: Diagnosis not present

## 2022-11-09 ENCOUNTER — Other Ambulatory Visit: Payer: Self-pay | Admitting: Sports Medicine

## 2022-12-28 DIAGNOSIS — Z23 Encounter for immunization: Secondary | ICD-10-CM | POA: Diagnosis not present

## 2023-01-22 DIAGNOSIS — H31091 Other chorioretinal scars, right eye: Secondary | ICD-10-CM | POA: Diagnosis not present

## 2023-01-22 DIAGNOSIS — Z961 Presence of intraocular lens: Secondary | ICD-10-CM | POA: Diagnosis not present

## 2023-01-22 DIAGNOSIS — H43811 Vitreous degeneration, right eye: Secondary | ICD-10-CM | POA: Diagnosis not present

## 2023-01-22 DIAGNOSIS — H04123 Dry eye syndrome of bilateral lacrimal glands: Secondary | ICD-10-CM | POA: Diagnosis not present

## 2023-02-19 ENCOUNTER — Other Ambulatory Visit: Payer: Self-pay | Admitting: Family Medicine

## 2023-02-19 DIAGNOSIS — Z1231 Encounter for screening mammogram for malignant neoplasm of breast: Secondary | ICD-10-CM

## 2023-03-09 ENCOUNTER — Ambulatory Visit
Admission: RE | Admit: 2023-03-09 | Discharge: 2023-03-09 | Disposition: A | Discharge status: Home / Self Care | Payer: Medicare Other | Source: Ambulatory Visit | Attending: Family Medicine | Admitting: Family Medicine

## 2023-03-09 DIAGNOSIS — Z1231 Encounter for screening mammogram for malignant neoplasm of breast: Secondary | ICD-10-CM

## 2023-03-22 DIAGNOSIS — E559 Vitamin D deficiency, unspecified: Secondary | ICD-10-CM | POA: Diagnosis not present

## 2023-03-22 DIAGNOSIS — Z79899 Other long term (current) drug therapy: Secondary | ICD-10-CM | POA: Diagnosis not present

## 2023-03-22 DIAGNOSIS — I10 Essential (primary) hypertension: Secondary | ICD-10-CM | POA: Diagnosis not present

## 2023-03-27 DIAGNOSIS — I10 Essential (primary) hypertension: Secondary | ICD-10-CM | POA: Diagnosis not present

## 2023-03-27 DIAGNOSIS — Z0000 Encounter for general adult medical examination without abnormal findings: Secondary | ICD-10-CM | POA: Diagnosis not present

## 2023-03-27 DIAGNOSIS — M818 Other osteoporosis without current pathological fracture: Secondary | ICD-10-CM | POA: Diagnosis not present

## 2023-03-27 DIAGNOSIS — E559 Vitamin D deficiency, unspecified: Secondary | ICD-10-CM | POA: Diagnosis not present

## 2023-03-27 DIAGNOSIS — Z1211 Encounter for screening for malignant neoplasm of colon: Secondary | ICD-10-CM | POA: Diagnosis not present

## 2023-04-02 DIAGNOSIS — Z1211 Encounter for screening for malignant neoplasm of colon: Secondary | ICD-10-CM | POA: Diagnosis not present

## 2023-05-07 IMAGING — MG MM DIGITAL SCREENING BILAT W/ TOMO AND CAD
8 series · 8 of 24 positions shown · non-contrast
Comparison: Previous exam(s).

CLINICAL DATA: Screening.

EXAM:
DIGITAL SCREENING BILATERAL MAMMOGRAM WITH TOMOSYNTHESIS AND CAD
TECHNIQUE: Bilateral screening digital craniocaudal and mediolateral oblique
mammograms were obtained. Bilateral screening digital breast
tomosynthesis was performed. The images were evaluated with
computer-aided detection.

[L MLO synth-2D]
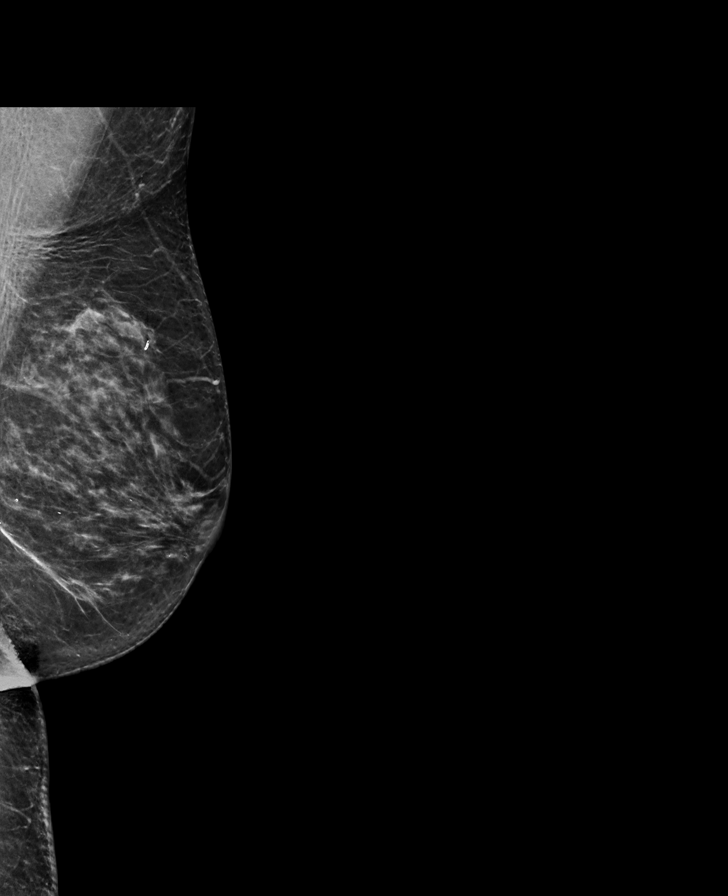

[L CC synth-2D]
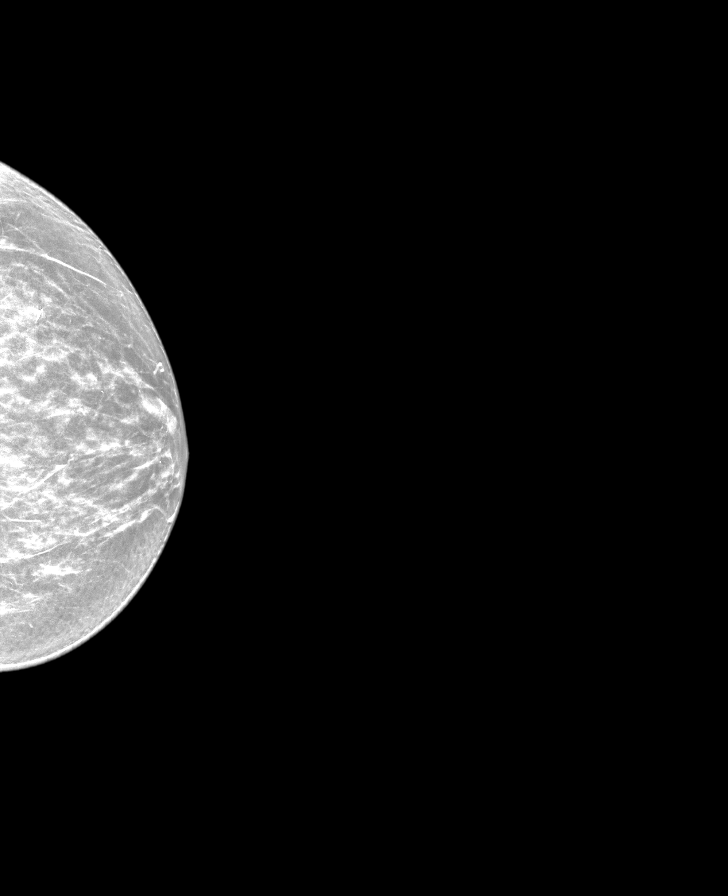

[R CC synth-2D]
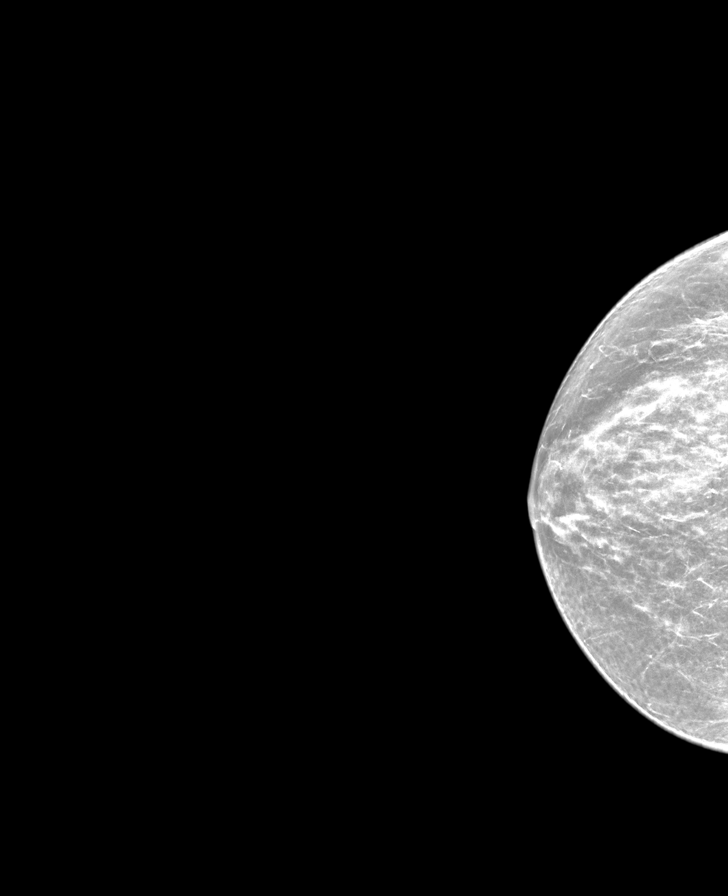

[R MLO synth-2D]
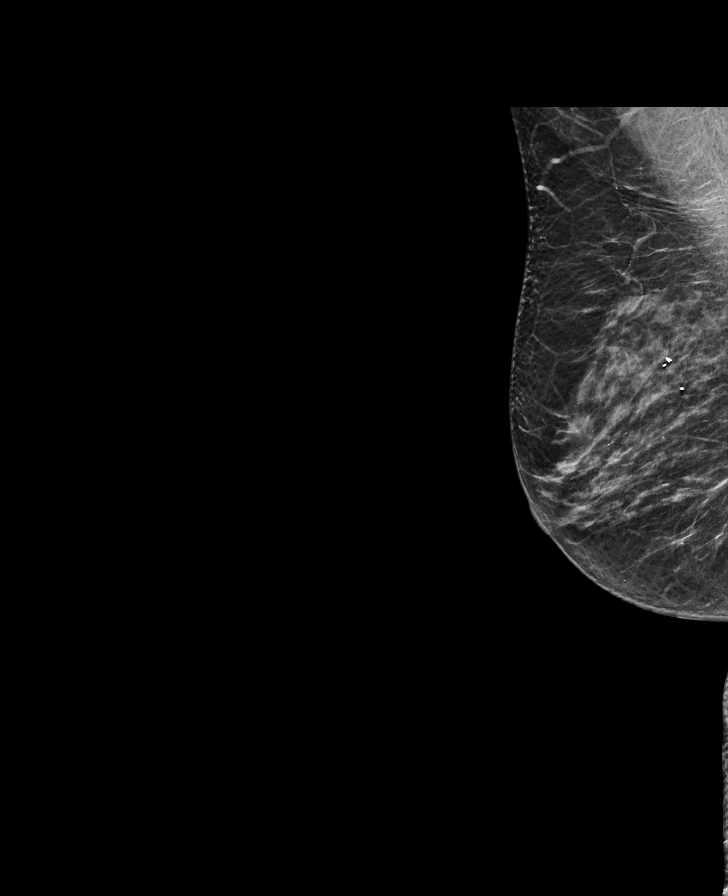

[L CC tomo · tomo slice 29/57.0]
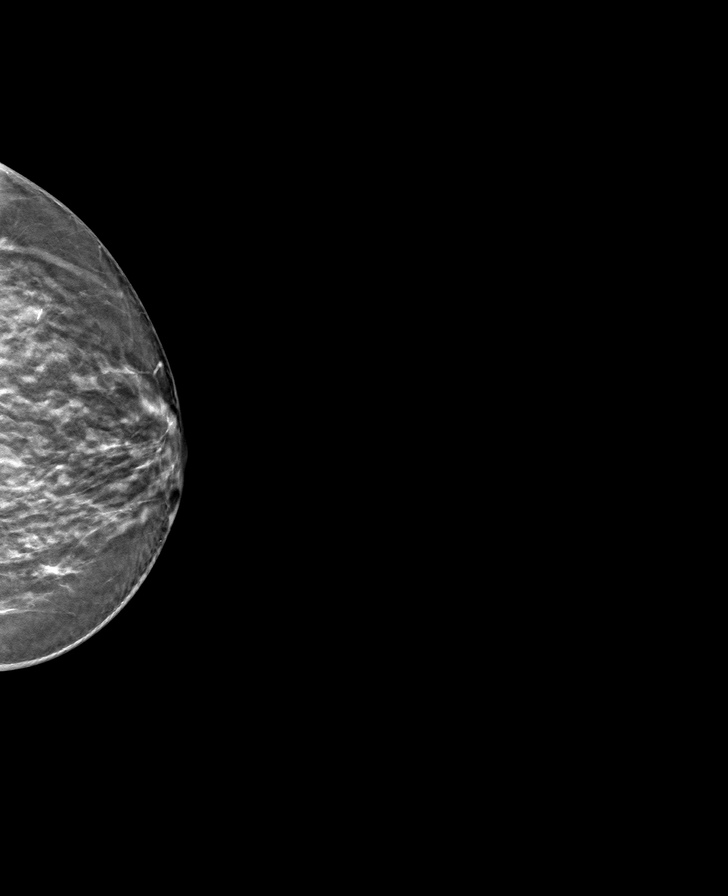

[R CC tomo · tomo slice 30/59.0]
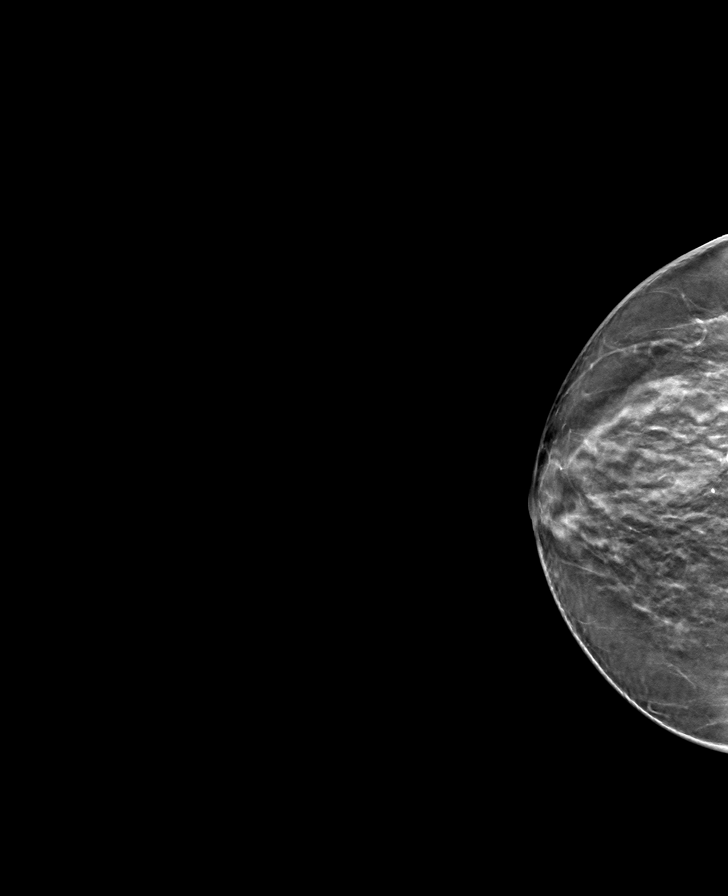

[L MLO tomo · tomo slice 33/65.0]
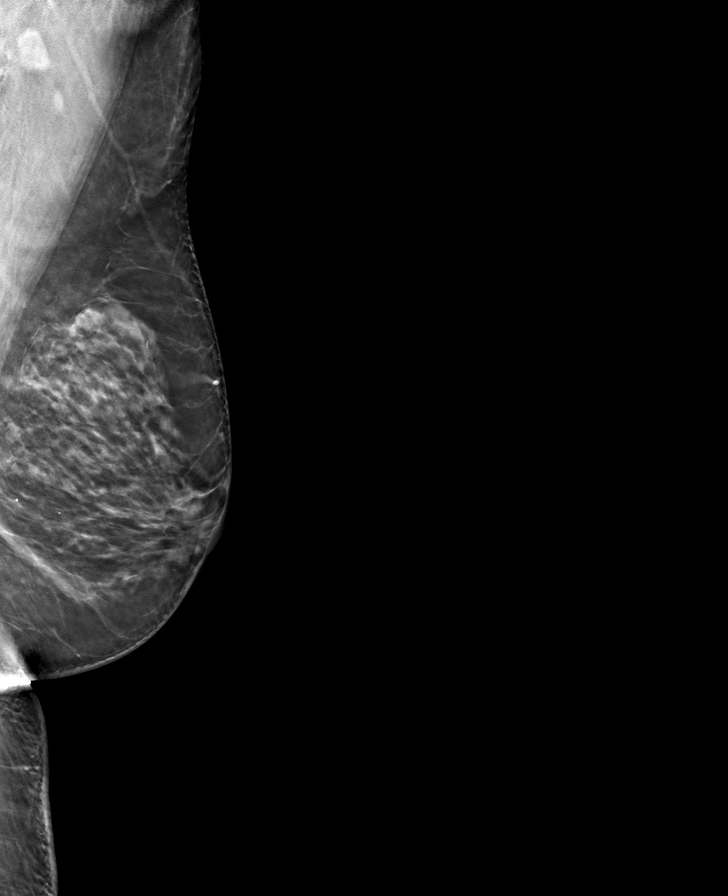

[R MLO tomo · tomo slice 31/60.0]
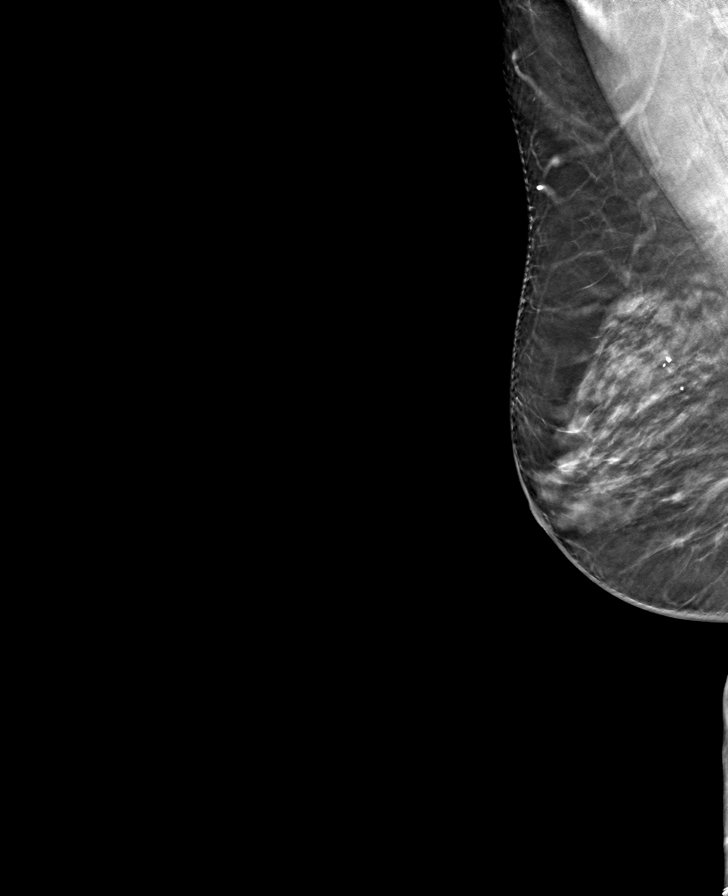

[8 of 24 positions shown; findings below may reference images not displayed]

ACR Breast Density Category c: The breast tissue is heterogeneously
dense, which may obscure small masses.
FINDINGS: There are no findings suspicious for malignancy.
IMPRESSION: No mammographic evidence of malignancy. A result letter of this
screening mammogram will be mailed directly to the patient.

RECOMMENDATION:
Screening mammogram in one year. (Code:Q3-W-BC3)

BI-RADS CATEGORY  1: Negative.

## 2023-05-10 DIAGNOSIS — L821 Other seborrheic keratosis: Secondary | ICD-10-CM | POA: Diagnosis not present

## 2023-05-10 DIAGNOSIS — L570 Actinic keratosis: Secondary | ICD-10-CM | POA: Diagnosis not present

## 2023-05-10 DIAGNOSIS — D485 Neoplasm of uncertain behavior of skin: Secondary | ICD-10-CM | POA: Diagnosis not present

## 2023-05-10 DIAGNOSIS — Z85828 Personal history of other malignant neoplasm of skin: Secondary | ICD-10-CM | POA: Diagnosis not present

## 2023-11-06 DIAGNOSIS — L0390 Cellulitis, unspecified: Secondary | ICD-10-CM | POA: Diagnosis not present

## 2023-11-06 DIAGNOSIS — Z23 Encounter for immunization: Secondary | ICD-10-CM | POA: Diagnosis not present

## 2023-11-06 DIAGNOSIS — I10 Essential (primary) hypertension: Secondary | ICD-10-CM | POA: Diagnosis not present

## 2023-12-20 ENCOUNTER — Emergency Department (HOSPITAL_BASED_OUTPATIENT_CLINIC_OR_DEPARTMENT_OTHER): Admission: EM | Admit: 2023-12-20 | Discharge: 2023-12-20 | Disposition: A | Discharge status: Home / Self Care

## 2023-12-20 ENCOUNTER — Encounter (HOSPITAL_BASED_OUTPATIENT_CLINIC_OR_DEPARTMENT_OTHER): Payer: Self-pay | Admitting: Emergency Medicine

## 2023-12-20 ENCOUNTER — Other Ambulatory Visit: Payer: Self-pay

## 2023-12-20 DIAGNOSIS — R000 Tachycardia, unspecified: Secondary | ICD-10-CM | POA: Insufficient documentation

## 2023-12-20 HISTORY — DX: Essential (primary) hypertension: I10

## 2023-12-20 LAB — BASIC METABOLIC PANEL WITH GFR
Anion gap: 11 (ref 5–15)
BUN: 12 mg/dL (ref 8–23)
CO2: 27 mmol/L (ref 22–32)
Calcium: 10.1 mg/dL (ref 8.9–10.3)
Chloride: 101 mmol/L (ref 98–111)
Creatinine, Ser: 0.82 mg/dL (ref 0.44–1.00)
GFR, Estimated: >60 mL/min (ref >60)
Glucose, Bld: 130 mg/dL — ABNORMAL HIGH (ref 70–99)
Potassium: 4.1 mmol/L (ref 3.5–5.1)
Sodium: 139 mmol/L (ref 135–145)

## 2023-12-20 LAB — CBC WITH DIFFERENTIAL/PLATELET
Abs Immature Granulocytes: 0.02 K/uL (ref 0.00–0.07)
Basophils Absolute: 0 K/uL (ref 0.0–0.1)
Basophils Relative: 0 %
Eosinophils Absolute: 0.1 K/uL (ref 0.0–0.5)
Eosinophils Relative: 1 %
HCT: 37.3 % (ref 36.0–46.0)
Hemoglobin: 12.7 g/dL (ref 12.0–15.0)
Immature Granulocytes: 0 %
Lymphocytes Relative: 19 %
Lymphs Abs: 1.8 K/uL (ref 0.7–4.0)
MCH: 30.6 pg (ref 26.0–34.0)
MCHC: 34 g/dL (ref 30.0–36.0)
MCV: 89.9 fL (ref 80.0–100.0)
Monocytes Absolute: 0.7 K/uL (ref 0.1–1.0)
Monocytes Relative: 8 %
Neutro Abs: 6.6 K/uL (ref 1.7–7.7)
Neutrophils Relative %: 72 %
Platelets: 390 K/uL (ref 150–400)
RBC: 4.15 MIL/uL (ref 3.87–5.11)
RDW: 12.8 % (ref 11.5–15.5)
WBC: 9.2 K/uL (ref 4.0–10.5)
nRBC: 0 % (ref 0.0–0.2)

## 2023-12-20 LAB — D-DIMER, QUANTITATIVE: D-Dimer, Quant: 0.31 ug{FEU}/mL (ref 0.00–0.50)

## 2023-12-20 LAB — TROPONIN T, HIGH SENSITIVITY: Troponin T High Sensitivity: <15 ng/L (ref 0–19)

## 2023-12-20 LAB — PRO BRAIN NATRIURETIC PEPTIDE: Pro Brain Natriuretic Peptide: <50 pg/mL (ref <300.0)

## 2023-12-20 MED ORDER — LACTATED RINGERS IV BOLUS
500.0000 mL | Freq: Once | INTRAVENOUS | Status: AC
  Administered 2023-12-20: 500 mL via INTRAVENOUS

## 2023-12-20 NOTE — ED Provider Notes (Signed)
 [place] EMERGENCY DEPARTMENT AT [facility] Provider Note   CSN: [id number] Arrival date & time: 12/20/23  1304     Patient presents with: Tachycardia   Ana Guzman is a 78 y.o. female.   78 year old female presents for evaluation of tachycardia.  States her symptoms started over the weekend she went to urgent care earlier today and they sent her to the ER for an abnormal EKG.  Patient states she noticed her heart rate was up in the 1 teens while she was just sitting.  She denies any chest pain or shortness of breath.  She denies any recent travel, history of PE and she is not on any blood thinners and has not had any leg swelling.  Denies any other symptoms or concerns.        Prior to Admission medications   Medication Sig Start Date End Date Taking? Authorizing Provider  diclofenac  (VOLTAREN ) 75 MG EC tablet Take 1 tablet (75 mg total) by mouth daily as needed. Take with food. 09/13/22   Arvell Lye R, DO  gabapentin  (NEURONTIN ) 300 MG capsule Take 1 capsule (300 mg total) by mouth at bedtime. 06/22/22   Arvell Lye SAUNDERS, DO  predniSONE  (DELTASONE ) 10 MG tablet Take as directed per MD instructions 06/22/22   Arvell Lye SAUNDERS, DO    Allergies: Alendronate, Sulfites, and Sulfa antibiotics    Review of Systems  Constitutional:  Negative for chills and fever.  HENT:  Negative for ear pain and sore throat.   Eyes:  Negative for pain and visual disturbance.  Respiratory:  Negative for cough and shortness of breath.   Cardiovascular:  Positive for palpitations. Negative for chest pain.  Gastrointestinal:  Negative for abdominal pain and vomiting.  Genitourinary:  Negative for dysuria and hematuria.  Musculoskeletal:  Negative for arthralgias and back pain.  Skin:  Negative for color change and rash.  Neurological:  Negative for seizures and syncope.  All other systems reviewed and are negative.   Updated Vital Signs BP (!) 156/85   Pulse 90   Temp  98.9 F (37.2 C) (Oral)   Resp 13   Wt 57.6 kg   SpO2 99%   BMI 23.23 kg/m   Physical Exam Vitals and nursing note reviewed.  Constitutional:      General: She is not in acute distress.    Appearance: She is well-developed.  HENT:     Head: Normocephalic and atraumatic.  Eyes:     Conjunctiva/sclera: Conjunctivae normal.  Cardiovascular:     Rate and Rhythm: Regular rhythm. Tachycardia present.     Heart sounds: No murmur heard. Pulmonary:     Effort: Pulmonary effort is normal. No respiratory distress.     Breath sounds: Normal breath sounds.  Abdominal:     Palpations: Abdomen is soft.     Tenderness: There is no abdominal tenderness.  Musculoskeletal:        General: No swelling.     Cervical back: Neck supple.  Skin:    General: Skin is warm and dry.     Capillary Refill: Capillary refill takes less than 2 seconds.  Neurological:     Mental Status: She is alert.  Psychiatric:        Mood and Affect: Mood normal.     (all labs ordered are listed, but only abnormal results are displayed) Labs Reviewed  BASIC METABOLIC PANEL WITH GFR - Abnormal; Notable for the following components:      Result Value  Glucose, Bld 130 (*)    All other components within normal limits  PRO BRAIN NATRIURETIC PEPTIDE  CBC WITH DIFFERENTIAL/PLATELET  D-DIMER, QUANTITATIVE  TROPONIN T, HIGH SENSITIVITY  TROPONIN T, HIGH SENSITIVITY    EKG: EKG Interpretation Date/Time:  Thursday December 20 2023 13:17:39 EST Ventricular Rate:  103 PR Interval:  198 QRS Duration:  138 QT Interval:  354 QTC Calculation: 464 R Axis:   64  Text Interpretation: Sinus tachycardia Right atrial enlargement Right bundle branch block No previous for comparison Confirmed by Gennaro Bouchard ([id number]) on 12/20/2023 1:32:39 PM  Radiology: No results found.   Procedures   Medications Ordered in the ED  lactated ringers bolus 500 mL (500 mLs Intravenous New Bag/Given 12/20/23 1439)                                     Medical Decision Making Cardiac monitor interpretation: Sinus rhythm to sinus tachycardia, no ectopy  Patient here for elevated heart rate sent in by urgent care.  EKG shows right atrial enlargement and sinus tachycardia but no other acute abnormality.  She otherwise appears well with minimal symptoms and otherwise stable vitals.  Heart rate improved after 500 cc of IV fluid down to the 80s.  Lab workup is unremarkable including negative troponin and D-dimer.  Patient advised close up with primary care doctor otherwise return to the ER for new or worsening symptoms.  She feels comfortable to plan be discharged home.  Problems Addressed: Tachycardia: undiagnosed new problem with uncertain prognosis  Amount and/or Complexity of Data Reviewed External Data Reviewed: notes.    Details: Urgent care records reviewed and patient was seen today and sent to the ER for further workup Labs: ordered. Decision-making details documented in ED Course.    Details: Ordered and reviewed by me and unremarkable, troponin and D-dimer negative ECG/medicine tests: ordered and independent interpretation performed. Decision-making details documented in ED Course.    Details: Ordered and interpreted by me in the absence of cardiology and shows right atrial lodgment, sinus tachycardia but no STEMI  Risk OTC drugs. Prescription drug management. Drug therapy requiring intensive monitoring for toxicity.     Final diagnoses:  Tachycardia    ED Discharge Orders     None          Gennaro Bouchard CROME, DO 12/20/23 1533

## 2023-12-20 NOTE — Discharge Instructions (Addendum)
 Drink lots of fluids and eat a balanced diet.  Follow-up with your primary care doctor in 1 to 2 weeks.  You may want to discuss with them a Holter monitor to wear

## 2023-12-20 NOTE — ED Notes (Signed)
 ED Provider at bedside.

## 2023-12-20 NOTE — ED Notes (Signed)
 Reviewed discharge instructions and home care with pt. Pt verbalized understanding and had no further questions. Pt exited ED without complications.

## 2023-12-20 NOTE — ED Triage Notes (Signed)
 Pt reports fast HR yesterday. States has been happening for last few weeks. Also having today. Endorses feeling lightheaded. Referred by UC Pt denies CP

## 2024-01-09 NOTE — Progress Notes (Signed)
 Cardiology Office Note:    Date:  01/15/2024   ID:  Rissa, Turley 1945/03/24, MRN [id number]  PCP:  Dartha Geralds, DO   [facility] Providers Cardiologist:  None     Referring MD: Verena Mems, MD   Chief Complaint  Patient presents with   Palpitations    History of Present Illness:    Ana Guzman is a 78 y.o. female seen at the request of Dr Dartha for evaluation of tachycardia. I also see her husband [name]. She has a history of HTN.   She states for several days in early November she felt lightheaded, tired and weird. HR was 120 bpm. Went to ED. Ecg showed NSR rate 103. Troponin, BNP, D dimer and CBC and BMET OK. CXR clear. Since then HR improved but still has random symptoms not associated with activity or stress. No chest pain or SOB. She did have a 2 week monitor placed by PCP which she sent in yesterday. No results to review.   Past Medical History:  Diagnosis Date   Asymptomatic postmenopausal status (age-related) (natural)    Hypertension    Osteopenia    Pure hyperglyceridemia    Sciatica     Past Surgical History:  Procedure Laterality Date   BREAST BIOPSY Left    CATARACT EXTRACTION, BILATERAL  2014   G0P0     WISDOM TOOTH EXTRACTION      Current Medications: Current Meds  Medication Sig   amLODipine (NORVASC) 5 MG tablet Take 7.5 mg by mouth daily.     Allergies:   Alendronate, Sulfites, and Sulfa antibiotics   Social History   Socioeconomic History   Marital status: Married    Spouse name: Not on file   Number of children: 2   Years of education: 16   Highest education level: Not on file  Occupational History   Occupation: Freight Forwarder: [employer]  Tobacco Use   Smoking status: Never   Smokeless tobacco: Never  Substance and Sexual Activity   Alcohol use: Not Currently    Comment: rarely -less than once a month   Drug use: Yes    Comment: delta 9   Sexual activity: Never  Other  Topics Concern   Not on file  Social History Narrative   [employer]   [employer] 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 2 ADOPTED CHILDREN  SON  1982    DAUGHTER  1986. MARRIAGE IS IN [facility]. NO H/O ABUSE            Social Drivers of Corporate Investment Banker Strain: Not on file  Food Insecurity: Not on file  Transportation Needs: Not on file  Physical Activity: Not on file  Stress: Not on file  Social Connections: Not on file     Family History: The patient's family history includes COPD in her father; Cancer in her sister and sister; Clotting disorder in her mother; Diabetes in her mother; Factor V Leiden deficiency in her mother; Heart disease in her father; Hyperlipidemia in her sister; Lupus in her sister; Stroke in her father. There is no history of Breast cancer.  ROS:   Please see the history of present illness.     All other systems reviewed and are negative.  EKGs/Labs/Other Studies Reviewed:    The following studies were reviewed today:  EKG Interpretation Date/Time:  Tuesday January 15 2024 14:28:35 EST  Ventricular Rate:  99 PR Interval:  166 QRS Duration:  136 QT Interval:  386 QTC Calculation: 495 R Axis:   -8  Text Interpretation: Sinus rhythm with occasional Premature ventricular complexes Right bundle branch block When compared with ECG of 20-Dec-2023 13:17, PVCs are new Confirmed by Skylyn Slezak [phone]) on 01/15/2024 2:37:22 PM    Recent Labs: 12/20/2023: BUN 12; Creatinine, Ser 0.82; Hemoglobin 12.7; Platelets 390; Potassium 4.1; Pro Brain Natriuretic Peptide <50.0; Sodium 139  Recent Lipid Panel    Component Value Date/Time   CHOL 186 09/12/2013 0753   TRIG 123.0 09/12/2013 0753   HDL 55.00 09/12/2013 0753   CHOLHDL 3 09/12/2013 0753   VLDL 24.6 09/12/2013 0753   LDLCALC 106 (H) 09/12/2013 0753   LDLDIRECT 132.4 10/23/2011 1604   EKG  Interpretation Date/Time:  Tuesday January 15 2024 14:28:35 EST Ventricular Rate:  99 PR Interval:  166 QRS Duration:  136 QT Interval:  386 QTC Calculation: 495 R Axis:   -8  Text Interpretation: Sinus rhythm with occasional Premature ventricular complexes Right bundle branch block When compared with ECG of 20-Dec-2023 13:17, PVCs are new Confirmed by Tannor Pyon [phone]) on 01/15/2024 2:37:22 PM    Risk Assessment/Calculations:           Physical Exam:    VS:  BP (!) 160/80 (BP Location: Left Arm, Patient Position: Sitting, Cuff Size: Normal)   Pulse 99   Ht 5' 2 (1.575 m)   Wt 128 lb (58.1 kg)   SpO2 98%   BMI 23.41 kg/m     Wt Readings from Last 3 Encounters:  01/15/24 128 lb (58.1 kg)  12/20/23 127 lb (57.6 kg)  06/22/22 128 lb (58.1 kg)     GEN:  Well nourished, well developed in no acute distress HEENT: Normal NECK: No JVD; No carotid bruits LYMPHATICS: No lymphadenopathy CARDIAC: RRR, gr 2/6 systolic murmur RUSB-LSB, no rubs, gallops RESPIRATORY:  Clear to auscultation without rales, wheezing or rhonchi  ABDOMEN: Soft, non-tender, non-distended MUSCULOSKELETAL:  No edema; No deformity  SKIN: Warm and dry NEUROLOGIC:  Alert and oriented x 3 PSYCHIATRIC:  Normal affect   ASSESSMENT:    1. Palpitations   2. Heart murmur   3. RBBB    PLAN:    In order of problems listed above:  Tachycardia/palpitations. Will request last labs from PCP to make sure thyroid  is ok. Await results of event monitor.  Murmur. Will check Echo RBBB new since 2011 - this is benign.  HTN- depending on assessment of rhythm may consider a beta blocker.            Medication Adjustments/Labs and Tests Ordered: Current medicines are reviewed at length with the patient today.  Concerns regarding medicines are outlined above.  Orders Placed This Encounter  Procedures   EKG 12-Lead   ECHOCARDIOGRAM COMPLETE   No orders of the defined types were placed in this  encounter.   Patient Instructions  Medication Instructions:  Continue same medications  Lab Work: None ordered  Testing/Procedures: Echo  first available  Follow-Up: At [facility], you and your health needs are our priority.  As part of our continuing mission to provide you with exceptional heart care, our providers are all part of one team.  This team includes your primary Cardiologist (physician) and Advanced Practice Providers or APPs (Physician Assistants and Nurse Practitioners) who all work together to provide you with the care you need, when you need it.  Your next appointment:  After Echo    Provider:  Dr.Stanisha Lorenz   We recommend signing up for the patient portal called MyChart.  Sign up information is provided on this After Visit Summary.  MyChart is used to connect with patients for Virtual Visits (Telemedicine).  Patients are able to view lab/test results, encounter notes, upcoming appointments, etc.  Non-urgent messages can be sent to your provider as well.   To learn more about what you can do with MyChart, go to [email or web address].      Signed, Shaliah Wann, MD  01/15/2024 3:15 PM    [facility]

## 2024-01-15 ENCOUNTER — Ambulatory Visit: Attending: Cardiology | Admitting: Cardiology

## 2024-01-15 ENCOUNTER — Encounter: Payer: Self-pay | Admitting: Cardiology

## 2024-01-15 VITALS — BP 160/80 | HR 99 | Ht 62.0 in | Wt 128.0 lb

## 2024-01-15 DIAGNOSIS — R011 Cardiac murmur, unspecified: Secondary | ICD-10-CM

## 2024-01-15 DIAGNOSIS — I4510 Unspecified right bundle-branch block: Secondary | ICD-10-CM | POA: Diagnosis not present

## 2024-01-15 DIAGNOSIS — R002 Palpitations: Secondary | ICD-10-CM | POA: Diagnosis not present

## 2024-01-15 NOTE — Patient Instructions (Addendum)
 Medication Instructions:  Continue same medications  Lab Work: None ordered  Testing/Procedures: Echo  first available  Follow-Up: At [facility], you and your health needs are our priority.  As part of our continuing mission to provide you with exceptional heart care, our providers are all part of one team.  This team includes your primary Cardiologist (physician) and Advanced Practice Providers or APPs (Physician Assistants and Nurse Practitioners) who all work together to provide you with the care you need, when you need it.  Your next appointment:  After Echo    Provider:  Dr.Jordan   We recommend signing up for the patient portal called MyChart.  Sign up information is provided on this After Visit Summary.  MyChart is used to connect with patients for Virtual Visits (Telemedicine).  Patients are able to view lab/test results, encounter notes, upcoming appointments, etc.  Non-urgent messages can be sent to your provider as well.   To learn more about what you can do with MyChart, go to [email or web address].

## 2024-01-21 ENCOUNTER — Telehealth: Payer: Self-pay

## 2024-01-21 NOTE — Telephone Encounter (Signed)
 Spoke to patient Dr.Jordan reviewed your monitor from [place].No Afib.No tachycardia.Occasional PVC.He advised we will wait and see what your echo shows.

## 2024-02-26 ENCOUNTER — Ambulatory Visit (HOSPITAL_COMMUNITY)
Admission: RE | Admit: 2024-02-26 | Discharge: 2024-02-26 | Disposition: A | Discharge status: Home / Self Care | Source: Ambulatory Visit | Attending: Cardiology | Admitting: Cardiology

## 2024-02-26 DIAGNOSIS — R002 Palpitations: Secondary | ICD-10-CM | POA: Insufficient documentation

## 2024-02-26 DIAGNOSIS — I4510 Unspecified right bundle-branch block: Secondary | ICD-10-CM | POA: Insufficient documentation

## 2024-02-26 DIAGNOSIS — R011 Cardiac murmur, unspecified: Secondary | ICD-10-CM | POA: Diagnosis present

## 2024-02-26 LAB — ECHOCARDIOGRAM COMPLETE
Area-P 1/2: 4.49 cm2
Est EF: >75
S' Lateral: 2.2 cm

## 2024-02-27 ENCOUNTER — Ambulatory Visit: Payer: Self-pay | Admitting: Cardiology

## 2024-02-29 ENCOUNTER — Other Ambulatory Visit: Payer: Self-pay

## 2024-02-29 ENCOUNTER — Encounter: Payer: Self-pay | Admitting: Cardiology

## 2024-02-29 MED ORDER — METOPROLOL SUCCINATE ER 25 MG PO TB24: 25.0000 mg | ORAL_TABLET | Freq: Every day | ORAL | 3 refills | Status: AC

## 2024-03-03 NOTE — Progress Notes (Unsigned)
 " Cardiology Office Note:    Date:  03/06/2024   ID:  Ana Guzman, Ana Guzman 06-30-1945, MRN [id number]  PCP:  Dartha Geralds, DO   [facility] Providers Cardiologist:  None     Referring MD: Dartha Geralds, DO   Chief Complaint  Patient presents with   Palpitations   Hypertension    History of Present Illness:    Ana Guzman is a 79 y.o. female seen at the request of Dr Dartha for evaluation of tachycardia. I also see her husband [name]. She has a history of HTN.   She states for several days in early November she felt lightheaded, tired and weird. HR was 120 bpm. Went to ED. Ecg showed NSR rate 103. Troponin, BNP, D dimer and CBC and BMET OK. CXR clear. Since then HR improved but still has random symptoms not associated with activity or stress. No chest pain or SOB. She did have a 2 week monitor placed by PCP which showed occ. PVC 1.3%. otherwise rare ectopy. Normal Echo. She was started on Toprol  XL. She states she only took 2 days and had insomnia on the second day so quit taking it. States she was on losartan before but quit taking it due to insomnia. Is using a THC infused gummy which she states helps insomnia.   Past Medical History:  Diagnosis Date   Asymptomatic postmenopausal status (age-related) (natural)    Hypertension    Osteopenia    Pure hyperglyceridemia    Sciatica     Past Surgical History:  Procedure Laterality Date   BREAST BIOPSY Left    CATARACT EXTRACTION, BILATERAL  2014   G0P0     WISDOM TOOTH EXTRACTION      Current Medications: Current Meds  Medication Sig   amLODipine  (NORVASC ) 10 MG tablet Take 1 tablet (10 mg total) by mouth daily.   [DISCONTINUED] amLODipine  (NORVASC ) 5 MG tablet Take 7.5 mg by mouth daily.     Allergies:   Alendronate, Sulfites, and Sulfa antibiotics   Social History   Socioeconomic History   Marital status: Married    Spouse name: Not on file   Number of children: 2   Years of education:  16   Highest education level: Not on file  Occupational History   Occupation: Freight Forwarder: [employer]  Tobacco Use   Smoking status: Never   Smokeless tobacco: Never  Substance and Sexual Activity   Alcohol use: Not Currently    Comment: rarely -less than once a month   Drug use: Yes    Comment: delta 9   Sexual activity: Never  Other Topics Concern   Not on file  Social History Narrative   [employer]   [employer]. 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 2 ADOPTED CHILDREN  SON  1982    DAUGHTER  1986. MARRIAGE IS IN FAIR HE[facility]. NO H/O ABUSE            Social Drivers of Health   Tobacco Use: Low Risk (03/06/2024)   Patient History    Smoking Tobacco Use: Never    Smokeless Tobacco Use: Never    Passive Exposure: Not on file  Financial Resource Strain: Not on file  Food Insecurity: Not on file  Transportation Needs: Not on file  Physical Activity: Not on file  Stress: Not on file  Social Connections: Not on file  Depression (EYV7-0): Not on  file  Alcohol Screen: Not on file  Housing: Not on file  Utilities: Not on file  Health Literacy: Not on file     Family History: The patient's family history includes COPD in her father; Cancer in her sister and sister; Clotting disorder in her mother; Diabetes in her mother; Factor V Leiden deficiency in her mother; Heart disease in her father; Hyperlipidemia in her sister; Lupus in her sister; Stroke in her father. There is no history of Breast cancer.  ROS:   Please see the history of present illness.     All other systems reviewed and are negative.  EKGs/Labs/Other Studies Reviewed:    The following studies were reviewed today: Echo 02/26/24: IMPRESSIONS     1. Left ventricular ejection fraction, by estimation, is >75%. Left  ventricular ejection fraction by 3D volume is 70 %. The left ventricle has  hyperdynamic function. The  left ventricle has no regional wall motion  abnormalities. There is mild left  ventricular hypertrophy. Left ventricular diastolic parameters are  consistent with Grade I diastolic dysfunction (impaired relaxation).   2. Right ventricular systolic function is normal. The right ventricular  size is normal.   3. The mitral valve is degenerative. No evidence of mitral valve  regurgitation. No evidence of mitral stenosis.   4. The aortic valve is tricuspid. Aortic valve regurgitation is not  visualized. No aortic stenosis is present.   5. The inferior vena cava is normal in size with greater than 50%  respiratory variability, suggesting right atrial pressure of 3 mmHg.   Comparison(s): No prior Echocardiogram.   Event monitor showed occ PVCs. 1.3%. otherwise rare ectopy.       Recent Labs: 12/20/2023: BUN 12; Creatinine, Ser 0.82; Hemoglobin 12.7; Platelets 390; Potassium 4.1; Pro Brain Natriuretic Peptide <50.0; Sodium 139  Recent Lipid Panel    Component Value Date/Time   CHOL 186 09/12/2013 0753   TRIG 123.0 09/12/2013 0753   HDL 55.00 09/12/2013 0753   CHOLHDL 3 09/12/2013 0753   VLDL 24.6 09/12/2013 0753   LDLCALC 106 (H) 09/12/2013 0753   LDLDIRECT 132.4 10/23/2011 1604        Risk Assessment/Calculations:           Physical Exam:    VS:  BP (!) 156/76   Pulse 74   Ht 5' 2 (1.575 m)   Wt 130 lb 9.6 oz (59.2 kg)   SpO2 95%   BMI 23.89 kg/m     Wt Readings from Last 3 Encounters:  03/06/24 130 lb 9.6 oz (59.2 kg)  01/15/24 128 lb (58.1 kg)  12/20/23 127 lb (57.6 kg)     GEN:  Well nourished, well developed in no acute distress HEENT: Normal NECK: No JVD; No carotid bruits LYMPHATICS: No lymphadenopathy CARDIAC: RRR, gr 2/6 systolic murmur RUSB-LSB, no rubs, gallops RESPIRATORY:  Clear to auscultation without rales, wheezing or rhonchi  ABDOMEN: Soft, non-tender, non-distended MUSCULOSKELETAL:  No edema; No deformity  SKIN: Warm and dry NEUROLOGIC:   Alert and oriented x 3 PSYCHIATRIC:  Normal affect   ASSESSMENT:    1. Primary hypertension   2. PVC (premature ventricular contraction)   3. Palpitations     PLAN:    In order of problems listed above:  Tachycardia/palpitations. Event monitor showed occasional PVCs. HR ok. She denies any palpitations currently. With normal EF these are benign. Unwilling to take metoprolol  due to insomnia.  Murmur. Based on echo this is a benign flow murmur RBBB new since 2011 -  this is benign.  HTN- still elevated. She wants to try higher dose of amlodipine  so will send in Rx for 10 mg daily. Can follow up with PCP      Given benign findings will follow up PRN   Medication Adjustments/Labs and Tests Ordered: Current medicines are reviewed at length with the patient today.  Concerns regarding medicines are outlined above.  No orders of the defined types were placed in this encounter.  Meds ordered this encounter  Medications   amLODipine  (NORVASC ) 10 MG tablet    Sig: Take 1 tablet (10 mg total) by mouth daily.    Dispense:  180 tablet    Refill:  3    Patient Instructions  Medication Instructions:   *If you need a refill on your cardiac medications before your next appointment, please call your pharmacy*  Lab Work:  If you have labs (blood work) drawn today and your tests are completely normal, you will receive your results only by: MyChart Message (if you have MyChart) OR A paper copy in the mail If you have any lab test that is abnormal or we need to change your treatment, we will call you to review the results.  Testing/Procedures:   Follow-Up: At [facility], you and your health needs are our priority.  As part of our continuing mission to provide you with exceptional heart care, our providers are all part of one team.  This team includes your primary Cardiologist (physician) and Advanced Practice Providers or APPs (Physician Assistants and Nurse Practitioners) who  all work together to provide you with the care you need, when you need it.  Your next appointment:      Provider:      We recommend signing up for the patient portal called MyChart.  Sign up information is provided on this After Visit Summary.  MyChart is used to connect with patients for Virtual Visits (Telemedicine).  Patients are able to view lab/test results, encounter notes, upcoming appointments, etc.  Non-urgent messages can be sent to your provider as well.   To learn more about what you can do with MyChart, go to [email or web address].             Signed, Parsa Rickett, MD  03/06/2024 9:18 AM    [facility]  "

## 2024-03-06 ENCOUNTER — Ambulatory Visit: Admitting: Cardiology

## 2024-03-06 ENCOUNTER — Encounter: Payer: Self-pay | Admitting: Cardiology

## 2024-03-06 VITALS — BP 156/76 | HR 74 | Ht 62.0 in | Wt 130.6 lb

## 2024-03-06 DIAGNOSIS — R002 Palpitations: Secondary | ICD-10-CM | POA: Diagnosis not present

## 2024-03-06 DIAGNOSIS — I493 Ventricular premature depolarization: Secondary | ICD-10-CM | POA: Diagnosis not present

## 2024-03-06 DIAGNOSIS — I10 Essential (primary) hypertension: Secondary | ICD-10-CM | POA: Diagnosis not present

## 2024-03-06 MED ORDER — AMLODIPINE BESYLATE 10 MG PO TABS: 10.0000 mg | ORAL_TABLET | Freq: Every day | ORAL | 3 refills | Status: AC

## 2024-03-06 NOTE — Patient Instructions (Addendum)
 Medication Instructions:  Increase Amlodipine  to 10 mg daily Continue all other medications   Lab Work: None ordered   Testing/Procedures:  None ordered   Follow-Up: At [facility], you and your health needs are our priority.  As part of our continuing mission to provide you with exceptional heart care, our providers are all part of one team.  This team includes your primary Cardiologist (physician) and Advanced Practice Providers or APPs (Physician Assistants and Nurse Practitioners) who all work together to provide you with the care you need, when you need it.  Your next appointment:  As Needed    Provider:  Dr.Jordan    We recommend signing up for the patient portal called MyChart.  Sign up information is provided on this After Visit Summary.  MyChart is used to connect with patients for Virtual Visits (Telemedicine).  Patients are able to view lab/test results, encounter notes, upcoming appointments, etc.  Non-urgent messages can be sent to your provider as well.   To learn more about what you can do with MyChart, go to [email or web address].
